# Patient Record
Sex: Female | Born: 2000 | Race: White | Hispanic: No | Marital: Single | Smoking: Never smoker
Health system: Southern US, Community
[De-identification: ages and names within clinical notes are randomized; demographics above are authoritative.]

## PROBLEM LIST (undated history)

## (undated) DIAGNOSIS — F4323 Adjustment disorder with mixed anxiety and depressed mood: Secondary | ICD-10-CM

## (undated) DIAGNOSIS — K219 Gastro-esophageal reflux disease without esophagitis: Secondary | ICD-10-CM

## (undated) DIAGNOSIS — E282 Polycystic ovarian syndrome: Secondary | ICD-10-CM

## (undated) HISTORY — PX: OTHER SURGICAL HISTORY: SHX169

---
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
## 2021-01-21 ENCOUNTER — Other Ambulatory Visit (HOSPITAL_COMMUNITY)
Admission: RE | Admit: 2021-01-21 | Discharge: 2021-01-21 | Disposition: A | Discharge status: Home / Self Care | Payer: Self-pay | Source: Ambulatory Visit | Attending: Family | Admitting: Family

## 2021-01-21 ENCOUNTER — Encounter: Payer: Self-pay | Admitting: Family

## 2021-01-21 ENCOUNTER — Ambulatory Visit: Payer: Self-pay | Admitting: Family

## 2021-01-21 ENCOUNTER — Ambulatory Visit (INDEPENDENT_AMBULATORY_CARE_PROVIDER_SITE_OTHER): Payer: Self-pay | Admitting: Family

## 2021-01-21 ENCOUNTER — Other Ambulatory Visit: Payer: Self-pay

## 2021-01-21 VITALS — BP 114/69 | HR 88 | Ht 63.25 in | Wt 208.0 lb

## 2021-01-21 DIAGNOSIS — Z3202 Encounter for pregnancy test, result negative: Secondary | ICD-10-CM

## 2021-01-21 DIAGNOSIS — Z3041 Encounter for surveillance of contraceptive pills: Secondary | ICD-10-CM

## 2021-01-21 DIAGNOSIS — R300 Dysuria: Secondary | ICD-10-CM

## 2021-01-21 DIAGNOSIS — Z113 Encounter for screening for infections with a predominantly sexual mode of transmission: Secondary | ICD-10-CM | POA: Insufficient documentation

## 2021-01-21 DIAGNOSIS — N898 Other specified noninflammatory disorders of vagina: Secondary | ICD-10-CM

## 2021-01-21 LAB — POCT URINE PREGNANCY: Preg Test, Ur: NEGATIVE

## 2021-01-21 NOTE — Progress Notes (Signed)
History was provided by the patient.  Sue Scott is a 20 y.o. female who is here for dysuria, vaginal discharge.   PCP confirmed? Yes.    Jay Schlichter, MD  HPI:   -pH is off, has been since BV. Doesn't smell fishy, but definitely a different odor -she and partner were not active for a week or so; he had something going on - dry skin  -dysuria and frequency x one week; taking cranberry pills no benefit  -no fever, abdominal pain, or pelvic pain  -2 missed pills about 2 days ago, has been really busy. Takes when she remembers.  -last active last night with partner.    Patient Active Problem List   Diagnosis Date Noted  . PCOS (polycystic ovarian syndrome) 03/07/2019  . Nexplanon in place 08/19/2018  . 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 Acne vulgaris 08/13/2017  . Hirsutism 06/18/2017  . 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 Adjustment disorder with mixed anxiety and depressed mood 06/18/2017    Current Outpatient Medications on File Prior to Visit  Medication Sig Dispense Refill  . norgestimate-ethinyl estradiol (SPRINTEC 28) 0.25-35 MG-MCG tablet Take 1 tablet by mouth daily. 84 tablet 3  . albuterol (VENTOLIN HFA) 108 (90 Base) MCG/ACT inhaler Inhale 2 puffs into the lungs every 4 (four) hours as needed for wheezing or shortness of breath. (Patient not taking: Reported on 01/21/2021) 6.7 g 0  . Cetirizine HCl (ZYRTEC PO) Take by mouth. (Patient not taking: Reported on 01/21/2021)    . EPINEPHrine 0.3 mg/0.3 mL IJ SOAJ injection Inject 0.3 mLs (0.3 mg total) into the muscle as needed for anaphylaxis. (Patient not taking: No sig reported) 1 each 2  . metroNIDAZOLE (FLAGYL) 500 MG tablet Take 1 tablet (500 mg total) by mouth 2 (two) times daily. (Patient not taking: Reported on 01/21/2021) 14 tablet 0  . norethindrone (AYGESTIN) 5 MG tablet Take 2 tablets (10 mg total) by mouth daily. 20 tablet 0   No current  facility-administered medications on file prior to visit.    Allergies  Allergen Reactions  . Other     ALL NUTS EXCEPT FOR PEANUTS    Physical Exam:    Vitals:   01/21/21 1436  BP: 114/69  Pulse: 88  Weight: 208 lb (94.3 kg)  Height: 5' 3.25" (1.607 m)    Blood pressure percentiles are not available for patients who are 18 years or older. No LMP recorded. (Menstrual status: Irregular Periods).  Physical Exam Constitutional:      Appearance: Normal appearance. She is not toxic-appearing.  HENT:     Head: Normocephalic.     Nose: Nose normal.  Eyes:     General: No scleral icterus.    Extraocular Movements: Extraocular movements intact.     Pupils: Pupils are equal, round, and reactive to light.  Cardiovascular:     Rate and Rhythm: Normal rate and regular rhythm.     Heart sounds: No murmur heard.   Pulmonary:     Effort: Pulmonary effort is normal.  Abdominal:     Tenderness: There is no abdominal tenderness.  Musculoskeletal:        General: No swelling. Normal range of motion.     Cervical back: Normal range of motion.  Lymphadenopathy:     Cervical: No cervical adenopathy.  Skin:    General: Skin is warm and dry.  Neurological:     General: No focal deficit  present.     Mental Status: She is alert and oriented to person, place, and time.  Psychiatric:        Mood and Affect: Mood is anxious.      Assessment/Plan:  Screen for infections, UTI, urine culture; advised condom use, discussed how to take pills when missed; she had to leave for another appt. Advised we would reach out by my chart for follow-up.    1. Dysuria - Urine Culture  2. Vaginal itching - WET PREP BY MOLECULAR PROBE  3. Visit for birth control pills maintenance  4. Routine screening for STI (sexually transmitted infection) - Urine cytology ancillary only  5. Pregnancy examination or test, negative result - POCT urine pregnancy

## 2021-01-22 ENCOUNTER — Other Ambulatory Visit: Payer: Self-pay | Admitting: Family

## 2021-01-22 ENCOUNTER — Encounter: Payer: Self-pay | Admitting: Family

## 2021-01-22 DIAGNOSIS — B9689 Other specified bacterial agents as the cause of diseases classified elsewhere: Secondary | ICD-10-CM

## 2021-01-22 DIAGNOSIS — N760 Acute vaginitis: Secondary | ICD-10-CM

## 2021-01-22 LAB — URINE CULTURE
MICRO NUMBER:: 11866176
SPECIMEN QUALITY:: ADEQUATE

## 2021-01-22 LAB — URINE CYTOLOGY ANCILLARY ONLY
Bacterial Vaginitis-Urine: NEGATIVE
Candida Urine: NEGATIVE
Chlamydia: NEGATIVE
Comment: NEGATIVE
Comment: NEGATIVE
Comment: NORMAL
Neisseria Gonorrhea: NEGATIVE
Trichomonas: NEGATIVE

## 2021-01-22 LAB — WET PREP BY MOLECULAR PROBE
Candida species: NOT DETECTED
MICRO NUMBER:: 11866158
SPECIMEN QUALITY:: ADEQUATE
Trichomonas vaginosis: NOT DETECTED

## 2021-01-22 MED ORDER — METRONIDAZOLE 500 MG PO TABS: 500.0000 mg | ORAL_TABLET | Freq: Two times a day (BID) | ORAL | 0 refills | Status: DC

## 2021-01-23 ENCOUNTER — Encounter: Payer: Self-pay | Admitting: Family

## 2021-02-26 ENCOUNTER — Other Ambulatory Visit (HOSPITAL_COMMUNITY)
Admission: RE | Admit: 2021-02-26 | Discharge: 2021-02-26 | Disposition: A | Discharge status: Home / Self Care | Payer: Self-pay | Source: Ambulatory Visit | Attending: Family | Admitting: Family

## 2021-02-26 ENCOUNTER — Ambulatory Visit (INDEPENDENT_AMBULATORY_CARE_PROVIDER_SITE_OTHER): Payer: Self-pay | Admitting: Family

## 2021-02-26 ENCOUNTER — Encounter: Payer: Self-pay | Admitting: Family

## 2021-02-26 ENCOUNTER — Other Ambulatory Visit: Payer: Self-pay

## 2021-02-26 VITALS — BP 131/76 | HR 104 | Ht 63.58 in | Wt 209.2 lb

## 2021-02-26 DIAGNOSIS — Z113 Encounter for screening for infections with a predominantly sexual mode of transmission: Secondary | ICD-10-CM | POA: Insufficient documentation

## 2021-02-26 DIAGNOSIS — R300 Dysuria: Secondary | ICD-10-CM

## 2021-02-26 DIAGNOSIS — B9689 Other specified bacterial agents as the cause of diseases classified elsewhere: Secondary | ICD-10-CM

## 2021-02-26 DIAGNOSIS — N760 Acute vaginitis: Secondary | ICD-10-CM

## 2021-02-26 DIAGNOSIS — Z1389 Encounter for screening for other disorder: Secondary | ICD-10-CM

## 2021-02-26 DIAGNOSIS — Z3202 Encounter for pregnancy test, result negative: Secondary | ICD-10-CM

## 2021-02-26 LAB — POCT URINALYSIS DIPSTICK
Bilirubin, UA: NEGATIVE
Blood, UA: NEGATIVE
Glucose, UA: NEGATIVE
Ketones, UA: NEGATIVE
Leukocytes, UA: NEGATIVE
Nitrite, UA: NEGATIVE
Protein, UA: NEGATIVE
Spec Grav, UA: 1.01 (ref 1.010–1.025)
Urobilinogen, UA: NEGATIVE E.U./dL — AB
pH, UA: 6.5 (ref 5.0–8.0)

## 2021-02-26 LAB — POCT URINE PREGNANCY: Preg Test, Ur: NEGATIVE

## 2021-02-26 NOTE — Progress Notes (Signed)
History was provided by the patient.  Sue Scott is a 20 y.o. female who is here for BV and dysuria.   PCP confirmed? Yes.    Jay Schlichter, MD  HPI:   -never finished Flagyl - took 1/2 of it  -BV symptoms present  -having some pain with urination  -went to [facility] - got tested for BV - gave pills and cream - used cream for 5 days; no improvement ; started spotting then discharge  -on BV pills x 10 days - still bleeding x 14 days  -taking sprintec no missed doses  -symptoms are getting better; bleeding is bothering her  -brown discharge  -not having intercourse as this time  -was using vaginal soap and now just using  -gets [place] waxes and unsure if this is contributing       Patient Active Problem List   Diagnosis Date Noted   PCOS (polycystic ovarian syndrome) 03/07/2019   Nexplanon in place 08/19/2018   Labial hypertrophy 08/13/2017   Acne vulgaris 08/13/2017   Hirsutism 06/18/2017   Morbid obesity with body mass index (BMI) 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 (Patient not taking: Reported on 01/21/2021) 6.7 g 0   Cetirizine HCl (ZYRTEC PO) Take by mouth. (Patient not taking: Reported on 01/21/2021)     EPINEPHrine 0.3 mg/0.3 mL IJ SOAJ injection Inject 0.3 mLs (0.3 mg total) into the muscle as needed for anaphylaxis. (Patient not taking: No sig reported) 1 each 2   metroNIDAZOLE (FLAGYL) 500 MG tablet Take 1 tablet (500 mg total) by mouth 2 (two) times daily. 14 tablet 0   norethindrone (AYGESTIN) 5 MG tablet Take 2 tablets (10 mg total) by mouth daily. 20 tablet 0   norgestimate-ethinyl estradiol (SPRINTEC 28)  0.25-35 MG-MCG tablet Take 1 tablet by mouth daily. 84 tablet 3   No current facility-administered medications on file prior to visit.    Allergies  Allergen Reactions   Other     ALL NUTS EXCEPT FOR PEANUTS    Physical Exam:    Vitals:   02/26/21 1623  BP: 131/76  Pulse: (!) 104  Weight: 209 lb 3.2 oz (94.9 kg)  Height: 5' 3.58" (1.615 m)    Blood pressure percentiles are not available for patients who are 18 years or older. No LMP recorded. (Menstrual status: Irregular Periods).  Physical Exam Vitals reviewed.  Constitutional:      Appearance: Normal appearance.  HENT:     Mouth/Throat:     Pharynx: Oropharynx is clear.  Eyes:     General: No scleral icterus.    Extraocular Movements: Extraocular movements intact.     Pupils: Pupils are equal, round, and reactive to light.  Cardiovascular:     Rate and Rhythm: Normal rate and regular rhythm.     Heart sounds: No murmur heard. Pulmonary:     Effort: Pulmonary effort is normal.  Abdominal:     General: There is no distension.  Musculoskeletal:        General: No swelling. Normal range of motion.     Cervical back: Normal range of motion.  Lymphadenopathy:     Cervical: No cervical  adenopathy.  Skin:    General: Skin is warm and dry.     Findings: No rash.  Neurological:     General: No focal deficit present.     Mental Status: She is alert and oriented to person, place, and time.  Psychiatric:        Mood and Affect: Mood normal.     Assessment/Plan:  -continue BV medications until complete -will culture urine; urine dip no nitrites  -vaginal hygiene reviewed; avoid OTC vaginal pH products -return precautions given   1. BV (bacterial vaginosis) 2. Dysuria 3. Routine screening for STI (sexually transmitted infection) - Urine cytology ancillary only - WET PREP BY MOLECULAR PROBE - Urine Culture  4. Pregnancy examination or test, negative result - POCT urine pregnancy  5. Screening for  genitourinary condition - POCT urinalysis dipstick

## 2021-02-27 LAB — URINE CULTURE
MICRO NUMBER:: 12006550
Result:: NO GROWTH
SPECIMEN QUALITY:: ADEQUATE

## 2021-02-27 LAB — WET PREP BY MOLECULAR PROBE
Candida species: NOT DETECTED
MICRO NUMBER:: 12006932
SPECIMEN QUALITY:: ADEQUATE
Trichomonas vaginosis: NOT DETECTED

## 2021-02-28 LAB — URINE CYTOLOGY ANCILLARY ONLY
Chlamydia: NEGATIVE
Comment: NEGATIVE
Comment: NORMAL
Neisseria Gonorrhea: NEGATIVE

## 2021-03-27 ENCOUNTER — Other Ambulatory Visit: Payer: Self-pay

## 2021-03-27 ENCOUNTER — Telehealth (INDEPENDENT_AMBULATORY_CARE_PROVIDER_SITE_OTHER): Payer: Self-pay | Admitting: Family

## 2021-03-27 ENCOUNTER — Encounter: Payer: Self-pay | Admitting: Family

## 2021-03-27 DIAGNOSIS — N898 Other specified noninflammatory disorders of vagina: Secondary | ICD-10-CM

## 2021-03-27 DIAGNOSIS — N921 Excessive and frequent menstruation with irregular cycle: Secondary | ICD-10-CM

## 2021-03-27 NOTE — Progress Notes (Signed)
THIS RECORD MAY CONTAIN CONFIDENTIAL INFORMATION THAT SHOULD NOT BE RELEASED WITHOUT REVIEW OF THE SERVICE PROVIDER.  Virtual Follow-Up Visit via Video Note  I connected with Sue Scott  on 03/27/21 at  2:30 PM EDT by a video enabled telemedicine application and verified that I am speaking with the correct person using two identifiers.   Patient/parent location: car in [place]    I discussed the limitations of evaluation and management by telemedicine and the availability of in person appointments.  I discussed that the purpose of this telehealth visit is to provide medical care while limiting exposure to the novel coronavirus.  The patient expressed understanding and agreed to proceed.   Sue Scott is a 20 y.o. female referred by Jay Schlichter, MD here today for follow-up of breakthrough bleeding with OCPs, PCOS.   History was provided by the patient.  Supervising Physician: Dr. Delorse Lek  Plan from Last Visit:   Sprintec Recently treated for BV  Chief Complaint: -breakthrough bleeding  -vaginal discharge   History of Present Illness:  -sprintec - last week took 4 pills (accidental, forgot she had already taken 2 in the morning and doubled again that evening) for breakthrough bleeding; subsequent nausea and headache, since resolved  -went back to one pill per day, bleeding came back  -more bleeding until yesterday, now just brown discharge -wants to make sure she doesn't have BV -no pain with intercourse, no pelvic pain  -some discharge changes, mostly brown sometimes with odor -no dysuria   Allergies  Allergen Reactions   Other     ALL NUTS EXCEPT FOR PEANUTS   Outpatient Medications Prior to Visit  Medication Sig Dispense Refill   albuterol (VENTOLIN HFA) 108 (90 Base) MCG/ACT inhaler Inhale 2 puffs into the lungs every 4 (four) hours as needed for wheezing or shortness of breath. (Patient not taking: Reported on 01/21/2021) 6.7 g 0   Cetirizine HCl (ZYRTEC PO) Take by  mouth. (Patient not taking: Reported on 01/21/2021)     EPINEPHrine 0.3 mg/0.3 mL IJ SOAJ injection Inject 0.3 mLs (0.3 mg total) into the muscle as needed for anaphylaxis. (Patient not taking: No sig reported) 1 each 2   metroNIDAZOLE (FLAGYL) 500 MG tablet Take 1 tablet (500 mg total) by mouth 2 (two) times daily. 14 tablet 0   norethindrone (AYGESTIN) 5 MG tablet Take 2 tablets (10 mg total) by mouth daily. 20 tablet 0   norgestimate-ethinyl estradiol (SPRINTEC 28) 0.25-35 MG-MCG tablet Take 1 tablet by mouth daily. 84 tablet 3   No facility-administered medications prior to visit.     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  Visual Observations/Objective:  General Appearance: Well nourished well developed, in no apparent distress.  Eyes: conjunctiva no swelling or erythema ENT/Mouth: No hoarseness, No cough for duration of visit.  Neck: Supple  Respiratory: Respiratory effort normal, normal rate, no retractions or distress.   Cardio: Appears well-perfused, noncyanotic Musculoskeletal: no obvious deformity Skin: visible  skin without rashes, ecchymosis, erythema Neuro: Awake and oriented X 3,  Psych:  normal affect, Insight and Judgment appropriate.    Assessment/Plan: 1. Breakthrough bleeding on OCPs 2. Vaginal discharge  -return back to one pill daily only; RN visit for swabs for wet prep/gc/c urine,  also pregnancy test    I discussed the assessment and treatment plan with the patient and/or parent/guardian.  They were provided an opportunity to ask questions and all were answered.  They agreed with the plan and demonstrated an understanding of the instructions. They were advised to call back or seek an in-person evaluation in the emergency room if the symptoms worsen or if the condition fails to improve as anticipated.   Follow-up:   pending swabs at RN visit   Medical decision-making:   I spent 20 minutes on this telehealth visit inclusive of face-to-face video and care coordination time I was located remote in [place] during this encounter.   Georges Mouse, NP    CC: Jay Schlichter, MD, Jay Schlichter, MD

## 2021-04-01 ENCOUNTER — Other Ambulatory Visit: Payer: Self-pay

## 2021-04-02 LAB — URINE CULTURE
MICRO NUMBER:: 12132649
SPECIMEN QUALITY:: ADEQUATE

## 2021-04-02 LAB — HIV ANTIBODY (ROUTINE TESTING W REFLEX): HIV 1&2 Ab, 4th Generation: NONREACTIVE

## 2021-04-02 LAB — VITAMIN D 25 HYDROXY (VIT D DEFICIENCY, FRACTURES): Vit D, 25-Hydroxy: 35 ng/mL (ref 30–100)

## 2021-04-03 ENCOUNTER — Encounter: Payer: Self-pay | Admitting: Family

## 2021-05-28 ENCOUNTER — Ambulatory Visit: Payer: Self-pay | Admitting: Family

## 2021-06-04 ENCOUNTER — Encounter: Payer: Self-pay | Admitting: Family

## 2021-06-04 ENCOUNTER — Ambulatory Visit (INDEPENDENT_AMBULATORY_CARE_PROVIDER_SITE_OTHER): Payer: Self-pay | Admitting: Family

## 2021-06-04 ENCOUNTER — Other Ambulatory Visit (HOSPITAL_COMMUNITY)
Admission: RE | Admit: 2021-06-04 | Discharge: 2021-06-04 | Disposition: A | Discharge status: Home / Self Care | Payer: Self-pay | Source: Ambulatory Visit | Attending: Family | Admitting: Family

## 2021-06-04 VITALS — BP 102/66 | HR 83 | Ht 64.0 in | Wt 207.0 lb

## 2021-06-04 DIAGNOSIS — N898 Other specified noninflammatory disorders of vagina: Secondary | ICD-10-CM

## 2021-06-04 DIAGNOSIS — Z113 Encounter for screening for infections with a predominantly sexual mode of transmission: Secondary | ICD-10-CM | POA: Insufficient documentation

## 2021-06-04 DIAGNOSIS — Z3202 Encounter for pregnancy test, result negative: Secondary | ICD-10-CM

## 2021-06-04 LAB — POCT URINALYSIS DIPSTICK
Bilirubin, UA: NEGATIVE
Blood, UA: NEGATIVE
Glucose, UA: NEGATIVE
Ketones, UA: NEGATIVE
Leukocytes, UA: NEGATIVE
Nitrite, UA: NEGATIVE
Protein, UA: POSITIVE — AB
Spec Grav, UA: 1.02 (ref 1.010–1.025)
Urobilinogen, UA: 0.2 E.U./dL
pH, UA: 6 (ref 5.0–8.0)

## 2021-06-04 NOTE — Progress Notes (Signed)
History was provided by the patient.  Sue Scott is a 20 y.o. female who is here for vaginal discharge.   PCP confirmed? Yes.    Jay Schlichter, MD  HPI:   -felt really depressed x 8 days about 1-2 weeks ago  -problems down there - feels like smell is bad  -urine smell was bad, yellow - urine smell stays on her  -tried cranberry juice and other stuff   -uses pH balance natural soap, [employer]  -tried using just water and smell didn't go away   -gets [place] wax  -no pain with intercourse  -stopped OCPs and has period every month  -LMP: ended on 9/12, bled for about 6-7 days   -has been going to therapy [facility]), trying to move out of home   -no pain or burning with urination  -wants to college - going to [employer] in [place], planing to move out which was a trigger with her mom   Stress eats, doesn't feel full   24 hr food recall  3 meals yesterday - [employer] Combo  Spaghetti os  Alfredo pasta  Pecan roll  A lot of fruit  Cupcake  Doesn't remember the rest  Water:  60 oz (about)   Wake time: 11AM Bed time: 3AM   Activity/Exercise: none but getting back to it soon   Patient Active Problem List   Diagnosis Date Noted   PCOS (polycystic ovarian syndrome) 03/07/2019   Nexplanon in place 08/19/2018   Labial hypertrophy 08/13/2017   Acne vulgaris 08/13/2017   Hirsutism 06/18/2017   Morbid obesity with body mass index (BMI) 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 (Patient not taking: Reported on 06/04/2021) 6.7 g 0   Cetirizine HCl (ZYRTEC PO) Take by mouth. (Patient not taking: Reported on 06/04/2021)      EPINEPHrine 0.3 mg/0.3 mL IJ SOAJ injection Inject 0.3 mLs (0.3 mg total) into the muscle as needed for anaphylaxis. (Patient not taking: Reported on 06/04/2021) 1 each 2   metroNIDAZOLE (FLAGYL) 500 MG tablet Take 1 tablet (500 mg total) by mouth 2 (two) times daily. 14 tablet 0   norethindrone (AYGESTIN) 5 MG tablet Take 2 tablets (10 mg total) by mouth daily. (Patient not taking: Reported on 06/04/2021) 20 tablet 0   norgestimate-ethinyl estradiol (SPRINTEC 28) 0.25-35 MG-MCG tablet Take 1 tablet by mouth daily. (Patient not taking: Reported on 06/04/2021) 84 tablet 3   No current facility-administered medications on file prior to visit.    Allergies  Allergen Reactions   Other     ALL NUTS EXCEPT FOR PEANUTS    Physical Exam:    Vitals:   06/04/21 1339  BP: 102/66  Pulse: 83  Weight: 207 lb (93.9 kg)  Height: 5\' 4"  (1.626 m)   Wt Readings from Last 3 Encounters:  06/04/21 207 lb (93.9 kg) (98 %, Z= 2.05)*  02/26/21 209 lb 3.2 oz (94.9 kg) (98 %, Z= 2.08)*  01/21/21 208 lb (94.3 kg) (98 %, Z= 2.06)*   * Growth percentiles are based on CDC (Girls, 2-20 Years) data.     Blood pressure percentiles are not available for patients who are  18 years or older. No LMP recorded. (Menstrual status: Irregular Periods).  Physical Exam Vitals reviewed.  Constitutional:      Appearance: Normal appearance. She is not toxic-appearing.  HENT:     Head: Normocephalic.     Mouth/Throat:     Pharynx: Oropharynx is clear.  Eyes:     General: No scleral icterus.    Extraocular Movements: Extraocular movements intact.     Pupils: Pupils are equal, round, and reactive to light.  Cardiovascular:     Rate and Rhythm: Normal rate and regular rhythm.     Heart sounds: No murmur heard. Pulmonary:     Effort: Pulmonary effort is normal.  Musculoskeletal:        General: No swelling. Normal range of motion.     Cervical back: Normal range of motion.  Lymphadenopathy:     Cervical: No  cervical adenopathy.  Skin:    General: Skin is warm and dry.     Findings: No rash.  Neurological:     General: No focal deficit present.     Mental Status: She is alert and oriented to person, place, and time.     Motor: No tremor.  Psychiatric:        Mood and Affect: Mood is anxious.     Assessment/Plan:  -unable to stay for pelvic exam to obtain genital culture  -probable BV based on history and symptoms  -will reach out via My Chart with results/medications needed  1. Foul smelling vaginal discharge - WET PREP BY MOLECULAR PROBE - Genital Culture - POCT urinalysis dipstick  2. Negative pregnancy test - POCT Pregnancy, Urine  3. Routine screening for STI (sexually transmitted infection) - Urine cytology ancillary only

## 2021-06-04 NOTE — Patient Instructions (Signed)

## 2021-06-05 ENCOUNTER — Other Ambulatory Visit: Payer: Self-pay | Admitting: Family

## 2021-06-05 ENCOUNTER — Encounter: Payer: Self-pay | Admitting: Family

## 2021-06-05 LAB — WET PREP BY MOLECULAR PROBE
Candida species: NOT DETECTED
MICRO NUMBER:: 12398297
SPECIMEN QUALITY:: ADEQUATE
Trichomonas vaginosis: NOT DETECTED

## 2021-06-05 MED ORDER — METRONIDAZOLE 500 MG PO TABS: 500.0000 mg | ORAL_TABLET | Freq: Two times a day (BID) | ORAL | 0 refills | Status: AC

## 2021-06-06 LAB — URINE CYTOLOGY ANCILLARY ONLY
Bacterial Vaginitis-Urine: NEGATIVE
Candida Urine: NEGATIVE
Chlamydia: NEGATIVE
Comment: NEGATIVE
Comment: NEGATIVE
Comment: NORMAL
Neisseria Gonorrhea: NEGATIVE
Trichomonas: NEGATIVE

## 2021-06-18 ENCOUNTER — Ambulatory Visit: Payer: Self-pay | Admitting: Family

## 2021-06-24 ENCOUNTER — Encounter: Payer: Self-pay | Admitting: Family

## 2021-06-24 ENCOUNTER — Other Ambulatory Visit: Payer: Self-pay

## 2021-06-24 ENCOUNTER — Ambulatory Visit (INDEPENDENT_AMBULATORY_CARE_PROVIDER_SITE_OTHER): Payer: Self-pay | Admitting: Family

## 2021-06-24 VITALS — BP 124/78 | HR 92 | Ht 63.78 in | Wt 211.0 lb

## 2021-06-24 DIAGNOSIS — E282 Polycystic ovarian syndrome: Secondary | ICD-10-CM

## 2021-06-24 DIAGNOSIS — Z3202 Encounter for pregnancy test, result negative: Secondary | ICD-10-CM

## 2021-06-24 DIAGNOSIS — Z113 Encounter for screening for infections with a predominantly sexual mode of transmission: Secondary | ICD-10-CM

## 2021-06-24 LAB — POCT URINE PREGNANCY: Preg Test, Ur: NEGATIVE

## 2021-06-24 LAB — POCT PREGNANCY, URINE

## 2021-06-24 MED ORDER — NORGESTREL-ETHINYL ESTRADIOL 0.3-30 MG-MCG PO TABS: 1.0000 | ORAL_TABLET | Freq: Every day | ORAL | 3 refills | Status: DC

## 2021-06-24 NOTE — Progress Notes (Signed)
History was provided by the patient.  Sue Scott is a 20 y.o. female who is here for BV follow up and .   PCP confirmed? Yes.    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 (Patient not taking: No sig reported) 6.7 g 0   Cetirizine HCl (ZYRTEC PO) Take by mouth. (Patient not taking: No sig reported)     EPINEPHrine 0.3 mg/0.3 mL IJ SOAJ injection Inject 0.3 mLs (0.3 mg total) into the muscle as needed for anaphylaxis. (Patient not taking: No sig reported) 1 each 2   norethindrone (AYGESTIN) 5 MG tablet Take 2 tablets (10 mg total) by mouth daily. (Patient not taking: No sig reported) 20 tablet 0   norgestimate-ethinyl estradiol (SPRINTEC 28) 0.25-35 MG-MCG tablet Take 1 tablet by mouth daily. (Patient not taking: No sig reported) 84 tablet 3   No current facility-administered medications on file prior to visit.    Allergies  Allergen Reactions   Other     ALL NUTS EXCEPT FOR PEANUTS    Physical Exam:    Vitals:   06/24/21 1602  BP: 124/78  Pulse: 92  Weight: 211 lb (95.7 kg)  Height: 5' 3.78" (1.62 m)   Wt Readings from Last 3 Encounters:  06/24/21 211  lb (95.7 kg) (98 %, Z= 2.10)*  06/04/21 207 lb (93.9 kg) (98 %, Z= 2.05)*  02/26/21 209 lb 3.2 oz (94.9 kg) (98 %, Z= 2.08)*   * Growth percentiles are based on CDC (Girls, 2-20 Years) data.     Blood pressure percentiles are not available for patients who are 18 years or older. Patient's last menstrual period was 05/16/2021.  Physical Exam Vitals reviewed.  Constitutional:      General: She is not in acute distress.    Appearance: Normal appearance.  HENT:     Head: Normocephalic.     Mouth/Throat:     Pharynx: Oropharynx is clear.  Eyes:     General: No scleral icterus.    Extraocular Movements: Extraocular movements intact.     Pupils: Pupils are equal, round, and reactive to light.  Neck:     Thyroid: No thyromegaly.  Cardiovascular:     Rate and Rhythm: Normal rate and regular rhythm.     Heart sounds: No murmur heard. Pulmonary:     Effort: Pulmonary effort is normal.  Musculoskeletal:        General: No swelling. Normal range of motion.     Cervical back: Normal range of motion and neck supple.  Lymphadenopathy:     Cervical: No cervical adenopathy.  Skin:    General: Skin is warm and dry.  Capillary Refill: Capillary refill takes less than 2 seconds.     Findings: No rash.  Neurological:     General: No focal deficit present.     Mental Status: She is alert and oriented to person, place, and time.     Motor: No tremor.  Psychiatric:        Mood and Affect: Mood is anxious.     Assessment/Plan:  1. PCOS (polycystic ovarian syndrome) -wants to restart birth control pills; no contraindication to estrogen use  -negative pregnancy test today  -last A1C was 4.9 on 12/29/19; repeat PCOS co-morb labs at next follow up   - norgestrel-ethinyl estradiol (LO/OVRAL) 0.3-30 MG-MCG tablet; Take 1 tablet by mouth daily.  Dispense: 84 tablet; Refill: 3  2. Negative pregnancy test - POCT Pregnancy, Urine - POCT urine pregnancy  3. Routine screening for STI  (sexually transmitted infection) -repeat for BV - WET PREP BY MOLECULAR PROBE

## 2021-06-25 ENCOUNTER — Encounter: Payer: Self-pay | Admitting: Family

## 2021-06-25 LAB — WET PREP BY MOLECULAR PROBE
Candida species: NOT DETECTED
MICRO NUMBER:: 12481867
SPECIMEN QUALITY:: ADEQUATE
Trichomonas vaginosis: NOT DETECTED

## 2021-06-26 ENCOUNTER — Other Ambulatory Visit: Payer: Self-pay | Admitting: Family

## 2021-06-26 ENCOUNTER — Other Ambulatory Visit: Payer: Self-pay | Admitting: Pediatrics

## 2021-06-26 ENCOUNTER — Telehealth: Payer: Self-pay

## 2021-06-26 MED ORDER — METRONIDAZOLE 500 MG PO TABS: 500.0000 mg | ORAL_TABLET | Freq: Two times a day (BID) | ORAL | 0 refills | Status: DC

## 2021-06-26 MED ORDER — METRONIDAZOLE 500 MG PO TABS: 500.0000 mg | ORAL_TABLET | Freq: Two times a day (BID) | ORAL | 1 refills | Status: DC

## 2021-06-26 NOTE — Telephone Encounter (Signed)
Patient called stating she went and picked up medication and it is the same dose and duration as she took before. She asks for clarification. Routing to [employer]

## 2021-06-27 NOTE — Telephone Encounter (Signed)
Sent MyChart message with Bernell List, FNP-C recommendations.

## 2021-07-25 ENCOUNTER — Encounter: Payer: Self-pay | Admitting: Family

## 2021-08-06 ENCOUNTER — Ambulatory Visit: Payer: Self-pay | Admitting: Family

## 2021-08-09 IMAGING — DX DG CHEST 1V PORT
1 series · 1 of 1 positions shown · non-contrast
Comparison: Two-view chest x-ray 10/12/2015

CLINICAL DATA: Cough and shortness of breath. H5CLL-4T positive
09/29/2018

EXAM:
PORTABLE CHEST 1 VIEW

[chest ap]
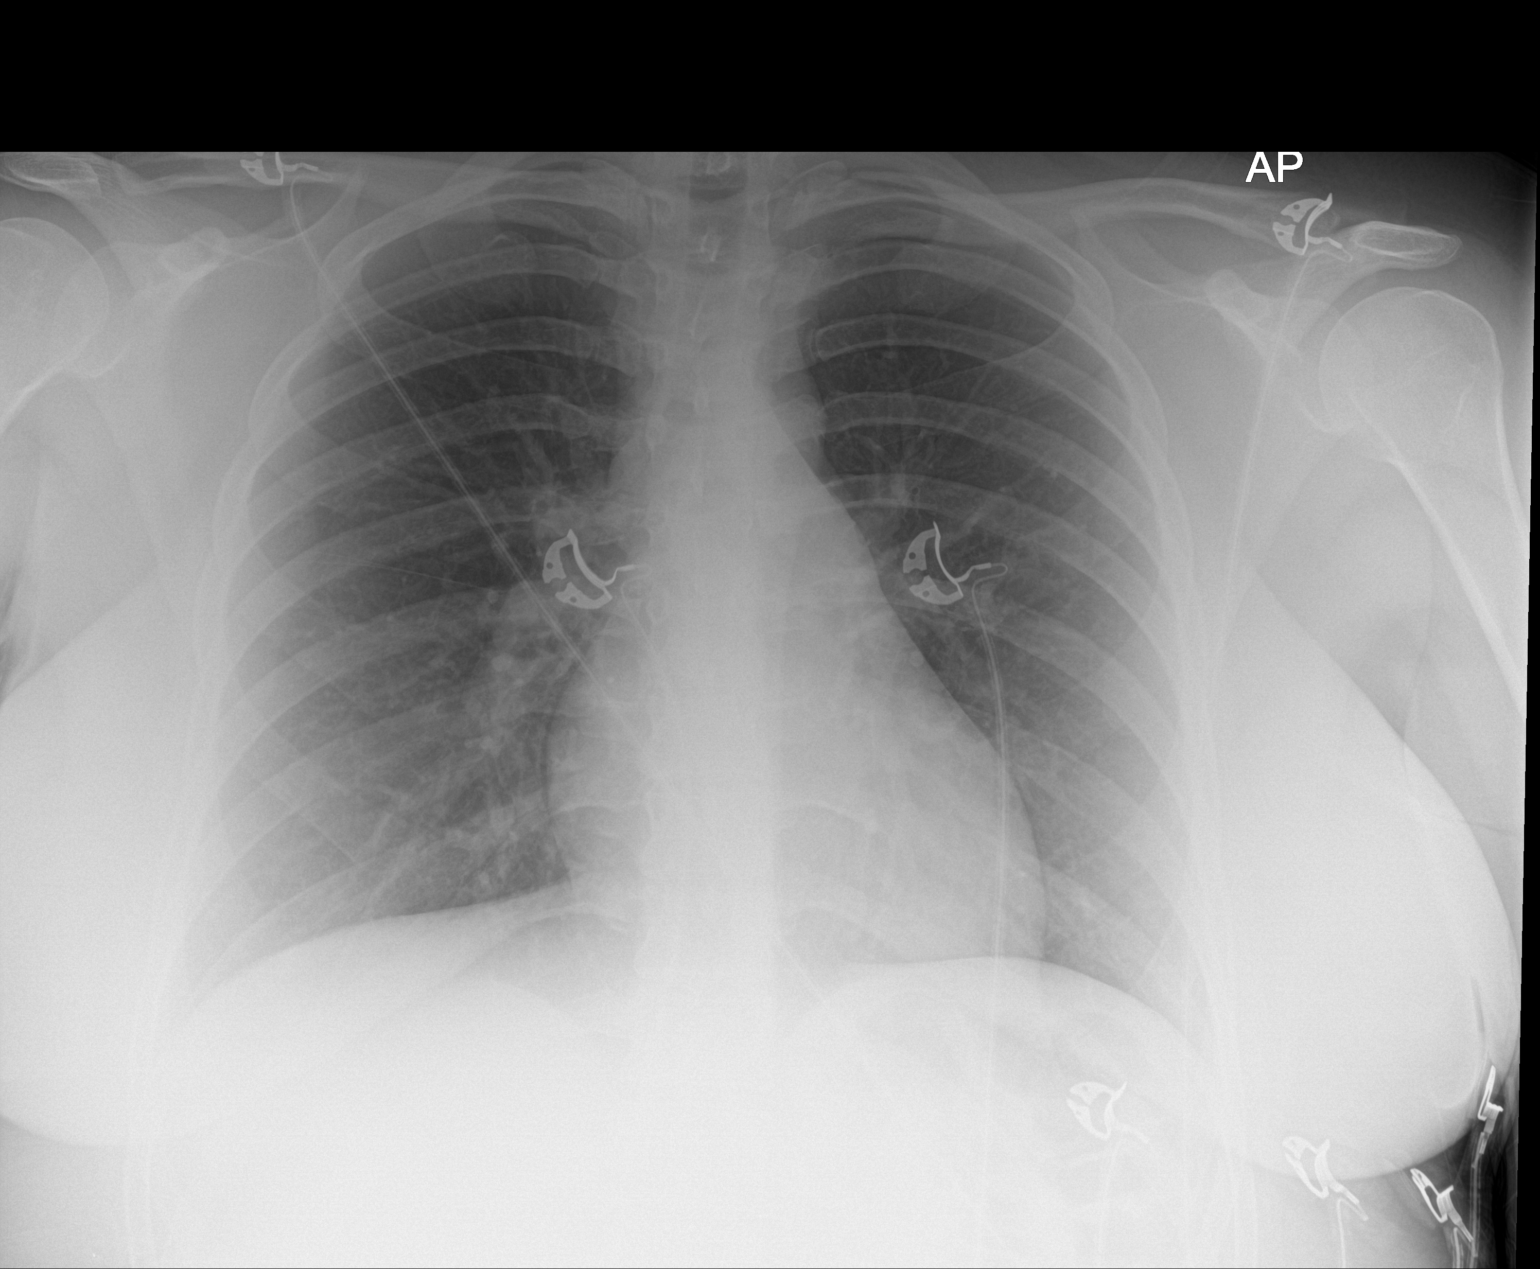

[1 of 1 positions shown; findings below may reference images not displayed]

FINDINGS: Heart size is normal. The lungs are clear. Visualized soft tissues
and bony thorax are unremarkable.
IMPRESSION: Negative chest.

## 2021-08-13 ENCOUNTER — Ambulatory Visit: Payer: Self-pay | Admitting: Family

## 2021-08-20 ENCOUNTER — Encounter: Payer: Self-pay | Admitting: Family

## 2021-08-20 ENCOUNTER — Ambulatory Visit (INDEPENDENT_AMBULATORY_CARE_PROVIDER_SITE_OTHER): Payer: Self-pay | Admitting: Family

## 2021-08-20 ENCOUNTER — Other Ambulatory Visit: Payer: Self-pay

## 2021-08-20 VITALS — BP 109/63 | HR 71 | Ht 63.48 in | Wt 213.6 lb

## 2021-08-20 DIAGNOSIS — Z3202 Encounter for pregnancy test, result negative: Secondary | ICD-10-CM

## 2021-08-20 DIAGNOSIS — N898 Other specified noninflammatory disorders of vagina: Secondary | ICD-10-CM

## 2021-08-20 DIAGNOSIS — B9689 Other specified bacterial agents as the cause of diseases classified elsewhere: Secondary | ICD-10-CM

## 2021-08-20 DIAGNOSIS — Z1389 Encounter for screening for other disorder: Secondary | ICD-10-CM

## 2021-08-20 DIAGNOSIS — N760 Acute vaginitis: Secondary | ICD-10-CM

## 2021-08-20 LAB — POCT URINALYSIS DIPSTICK
Bilirubin, UA: NEGATIVE
Blood, UA: NEGATIVE
Glucose, UA: NEGATIVE
Ketones, UA: POSITIVE
Nitrite, UA: NEGATIVE
Protein, UA: POSITIVE — AB
Spec Grav, UA: 1.015 (ref 1.010–1.025)
Urobilinogen, UA: 1 E.U./dL
pH, UA: 7 (ref 5.0–8.0)

## 2021-08-20 LAB — POCT URINE PREGNANCY: Preg Test, Ur: NEGATIVE

## 2021-08-20 NOTE — Progress Notes (Signed)
History was provided by the patient.  Sue Scott is a 20 y.o. female who is here for BV follow-up.   PCP confirmed? Yes.    Jay Schlichter, MD  HPI:   -returns to clinic for concerns for BV  -sometimes will burn with urination; last episode on Saturday, sometimes with frequency, usually the day after intercourse; sometimes will forget to pee after sex; BF allergic to latex; using withdrawal method  -changed soap and it helped a lot; no fragrance and that has improved  -had a rectal tear a few months ago from anal sex, was a lot of bleeding; healed then tried anal sex again a few weeks ago and had same issue; not using lubricant, unsure if she needs a medication. Has not seen blood in stool or wiping since healed from second attempt -boric acid tablets is helping + taking cranberry pills; wants to know if her partner can be treated -he has very dry skin and she wants to know if this is triggering the BV for her.    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 (Patient not taking: Reported on 08/20/2021) 14 tablet 1   norgestrel-ethinyl estradiol (LO/OVRAL) 0.3-30 MG-MCG tablet Take 1 tablet by mouth daily. (Patient not taking: Reported on 08/20/2021) 84 tablet 3   No current facility-administered medications on file prior to visit.    Allergies  Allergen Reactions   Other     ALL NUTS EXCEPT FOR PEANUTS    Physical Exam:    Vitals:   08/20/21  1113  BP: 109/63  Pulse: 71  Weight: 213 lb 9.6 oz (96.9 kg)  Height: 5' 3.48" (1.612 m)   Wt Readings from Last 3 Encounters:  08/20/21 213 lb 9.6 oz (96.9 kg) (98 %, Z= 2.13)*  06/24/21 211 lb (95.7 kg) (98 %, Z= 2.10)*  06/04/21 207 lb (93.9 kg) (98 %, Z= 2.05)*   * Growth percentiles are based on CDC (Girls, 2-20 Years) data.    Blood pressure percentiles are not available for patients who are 18 years or older. No LMP recorded. (Menstrual status: Irregular Periods).  Physical Exam Constitutional:      General: She is not in acute distress. HENT:     Head: Normocephalic.     Mouth/Throat:     Pharynx: Oropharynx is clear.  Eyes:     General: No scleral icterus.    Extraocular Movements: Extraocular movements intact.     Pupils: Pupils are equal, round, and reactive to light.  Cardiovascular:     Rate and Rhythm: Normal rate and regular rhythm.     Heart sounds: No murmur heard. Pulmonary:     Effort: Pulmonary effort is normal.  Genitourinary:    Comments: Deferred; self swab today  Musculoskeletal:        General: Normal range of motion.     Cervical back: Normal range of motion and neck supple.  Lymphadenopathy:     Cervical: No cervical  adenopathy.  Skin:    General: Skin is warm and dry.     Capillary Refill: Capillary refill takes less than 2 seconds.     Findings: No rash.  Neurological:     General: No focal deficit present.     Mental Status: She is alert and oriented to person, place, and time.  Psychiatric:        Mood and Affect: Mood normal.     Assessment/Plan:  20 yo female with recurrent BV. Symptoms likely consistent with reinfection; UA dip is unremarkable excluding protein + today as well as in September. Will continue to monitor symptoms; BP WNL today. Discussed vaginal hygiene as well as using lubricant for sexual intercourse; advised to monitor symptoms closely 2/2 anal sex and rectal bleeding. Exam deferred today as symptoms are  consistent with BV and no rectal bleeding or pain at present; would recommend GU exam/pelvic at next follow up if symptoms persists. Consider clindamycin vaginal gel for BV for this course.  She declines birth control at this time. Will send multivitamin with folic acid.  1. Vaginal discharge 2. BV (bacterial vaginosis) 3. Screening for genitourinary condition - POCT urinalysis dipstick 4. Pregnancy examination or test, negative result - POCT urine pregnancy

## 2021-08-21 ENCOUNTER — Encounter: Payer: Self-pay | Admitting: Family

## 2021-08-21 ENCOUNTER — Other Ambulatory Visit: Payer: Self-pay | Admitting: Family

## 2021-08-21 LAB — WET PREP BY MOLECULAR PROBE
Candida species: NOT DETECTED
MICRO NUMBER:: 12721006
SPECIMEN QUALITY:: ADEQUATE
Trichomonas vaginosis: NOT DETECTED

## 2021-08-21 MED ORDER — CENTRUM PO CHEW: 1.0000 | CHEWABLE_TABLET | Freq: Every day | ORAL | 2 refills | Status: AC

## 2021-08-21 MED ORDER — CLINDAMYCIN PHOSPHATE 2 % VA CREA: 1.0000 | TOPICAL_CREAM | Freq: Every day | VAGINAL | 0 refills | Status: AC

## 2022-01-01 ENCOUNTER — Encounter: Payer: Self-pay | Admitting: Family

## 2022-01-07 ENCOUNTER — Ambulatory Visit: Payer: Self-pay | Admitting: Family

## 2022-01-23 ENCOUNTER — Encounter: Payer: Self-pay | Admitting: Family

## 2022-01-27 ENCOUNTER — Ambulatory Visit
Admission: EM | Admit: 2022-01-27 | Discharge: 2022-01-27 | Disposition: A | Discharge status: Home / Self Care | Payer: Self-pay | Attending: Family Medicine | Admitting: Family Medicine

## 2022-01-27 ENCOUNTER — Other Ambulatory Visit: Payer: Self-pay

## 2022-01-27 DIAGNOSIS — J029 Acute pharyngitis, unspecified: Secondary | ICD-10-CM | POA: Insufficient documentation

## 2022-01-27 DIAGNOSIS — J069 Acute upper respiratory infection, unspecified: Secondary | ICD-10-CM | POA: Insufficient documentation

## 2022-01-27 LAB — POCT RAPID STREP A (OFFICE): Rapid Strep A Screen: NEGATIVE

## 2022-01-27 MED ORDER — PREDNISONE 20 MG PO TABS: 40.0000 mg | ORAL_TABLET | Freq: Every day | ORAL | 0 refills | Status: AC

## 2022-01-27 MED ORDER — ALBUTEROL SULFATE HFA 108 (90 BASE) MCG/ACT IN AERS: 1.0000 | INHALATION_SPRAY | RESPIRATORY_TRACT | 0 refills | Status: AC | PRN

## 2022-01-27 NOTE — ED Provider Notes (Signed)
?[facility] ? ? ? ?CSN: [id number] ?Arrival date & time: 01/27/22  1258 ? ? ?  ? ?History   ?Chief Complaint ?Chief Complaint  ?Patient presents with  ? Sore Throat  ? ? ?HPI ?Sue Scott is a 21 y.o. female.  ? ? ?Sore Throat ? ?Here for sore throat that began last night, pronounced. No f/c. ? ?About 4 days ago began having anterior sternal pain, not feeling good, and some cough. Some malaise. ? ?Does have asthma, but uncertain if has bothered her more in the last week. ? ?Boyfriend has been sick with similar. ? ? ? ?Past Medical History:  ?Diagnosis Date  ? Adjustment disorder with mixed anxiety and depressed mood   ? Asthma   ? GERD (gastroesophageal reflux disease)   ? PCOS (polycystic ovarian syndrome)   ? ? ?Patient Active Problem List  ? Diagnosis Date Noted  ? PCOS (polycystic ovarian syndrome) 03/07/2019  ? Nexplanon in place 08/19/2018  ? Labial hypertrophy 08/13/2017  ? Acne vulgaris 08/13/2017  ? Hirsutism 06/18/2017  ? Morbid obesity with body mass index (BMI) greater than 99th percentile for age in childhood [facility](HCC) 06/18/2017  ? Adjustment disorder with mixed anxiety and depressed mood 06/18/2017  ? ? ?Past Surgical History:  ?Procedure Laterality Date  ? adnoidectomy    ? extraction of wisdom teeth    ? ? ?OB History   ?No obstetric history on file. ?  ? ? ? ?Home Medications   ? ?Prior to Admission medications   ?Medication Sig Start Date End Date Taking? Authorizing Provider  ?albuterol (VENTOLIN HFA) 108 (90 Base) MCG/ACT inhaler Inhale 1-2 puffs into the lungs every 4 (four) hours as needed for wheezing or shortness of breath. 01/27/22  Yes Zenia ResidesBanister, Gola Bribiesca K, MD  ?predniSONE (DELTASONE) 20 MG tablet Take 2 tablets (40 mg total) by mouth daily with breakfast for 5 days. 01/27/22 02/01/22 Yes Zenia ResidesBanister, Deaglan Lile K, MD  ?multivitamin-iron-minerals-folic acid (CENTRUM) chewable tablet Chew 1 tablet by mouth daily. 08/21/21   Georges MouseJones, [name] M, NP  ?norgestrel-ethinyl estradiol (LO/OVRAL) 0.3-30  MG-MCG tablet Take 1 tablet by mouth daily. ?Patient not taking: Reported on 08/20/2021 06/24/21   Georges MouseJones, [name] M, NP  ? ? ?Family History ?Family History  ?Problem Relation Age of Onset  ? Healthy Mother   ? Healthy Father   ? Asthma Maternal Aunt   ? Crohn's disease Maternal Aunt   ? Asthma Maternal Grandmother   ? Anxiety disorder Maternal Grandmother   ? Depression Maternal Grandmother   ? Anxiety disorder Paternal Grandmother   ? Depression Paternal Grandmother   ? Lung disease Paternal Grandfather   ? Pulmonary fibrosis Paternal Grandfather   ? ? ?Social History ?Social History  ? ?Tobacco Use  ? Smoking status: Never  ?  Passive exposure: Yes  ? Smokeless tobacco: Never  ?Vaping Use  ? Vaping Use: Never used  ?Substance Use Topics  ? Alcohol use: No  ? Drug use: No  ? ? ? ?Allergies   ?Other ? ? ?Review of Systems ?Review of Systems ? ? ?Physical Exam ?Triage Vital Signs ?ED Triage Vitals  ?Enc Vitals Group  ?   BP 01/27/22 1459 122/80  ?   Pulse Rate 01/27/22 1459 88  ?   Resp 01/27/22 1459 18  ?   Temp 01/27/22 1459 98.4 ?F (36.9 ?C)  ?   Temp Source 01/27/22 1459 Oral  ?   SpO2 01/27/22 1459 97 %  ?   Weight --   ?  Height --   ?   Head Circumference --   ?   Peak Flow --   ?   Pain Score 01/27/22 1458 6  ?   Pain Loc --   ?   Pain Edu? --   ?   Excl. in GC? --   ? ?No data found. ? ?Updated Vital Signs ?BP 122/80 (BP Location: Right Arm)   Pulse 88   Temp 98.4 ?F (36.9 ?C) (Oral)   Resp 18   SpO2 97%  ? ?Visual Acuity ?Right Eye Distance:   ?Left Eye Distance:   ?Bilateral Distance:   ? ?Right Eye Near:   ?Left Eye Near:    ?Bilateral Near:    ? ?Physical Exam ?Vitals reviewed.  ?Constitutional:   ?   General: She is not in acute distress. ?   Appearance: She is not toxic-appearing.  ?HENT:  ?   Right Ear: Tympanic membrane and ear canal normal.  ?   Left Ear: Tympanic membrane and ear canal normal.  ?   Nose: Nose normal.  ?   Mouth/Throat:  ?   Mouth: Mucous membranes are moist.  ?   Comments:  Erythema of the posterior OP and tonsillar pillars. No asymmetry. ?Eyes:  ?   Extraocular Movements: Extraocular movements intact.  ?   Conjunctiva/sclera: Conjunctivae normal.  ?   Pupils: Pupils are equal, round, and reactive to light.  ?Cardiovascular:  ?   Rate and Rhythm: Normal rate and regular rhythm.  ?   Heart sounds: No murmur heard. ?Pulmonary:  ?   Effort: Pulmonary effort is normal. No respiratory distress.  ?   Breath sounds: No wheezing, rhonchi or rales.  ?Chest:  ?   Chest wall: Tenderness (anterior central chest) present.  ?Musculoskeletal:  ?   Cervical back: Neck supple.  ?Lymphadenopathy:  ?   Cervical: No cervical adenopathy.  ?Skin: ?   Capillary Refill: Capillary refill takes less than 2 seconds.  ?   Coloration: Skin is not jaundiced or pale.  ?Neurological:  ?   General: No focal deficit present.  ?   Mental Status: She is alert and oriented to person, place, and time.  ?Psychiatric:     ?   Behavior: Behavior normal.  ? ? ? ?UC Treatments / Results  ?Labs ?(all labs ordered are listed, but only abnormal results are displayed) ?Labs Reviewed  ?CULTURE, GROUP A STREP [facility])  ?NOVEL CORONAVIRUS, NAA  ?POCT RAPID STREP A (OFFICE)  ? ? ?EKG ? ? ?Radiology ?No results found. ? ?Procedures ?Procedures (including critical care time) ? ?Medications Ordered in UC ?Medications - No data to display ? ?Initial Impression / Assessment and Plan / UC Course  ?I have reviewed the triage vital signs and the nursing notes. ? ?Pertinent labs & imaging results that were available during my care of the patient were reviewed by me and considered in my medical decision making (see chart for details). ? ?  ? ?Strep is negative; culture sent, and we will call and tx per protocol if positive. Covid swab done to see if needs to quarantine further. She is already at day 4, so unlikely oral antivirals to be of value if positive. ? ?I will tx poss asthma exacerbation with albuterol and prednisone. ?Final Clinical  Impressions(s) / UC Diagnoses  ? ?Final diagnoses:  ?Sore throat  ?Acute upper respiratory infection  ? ? ? ?Discharge Instructions   ? ?  ?Your strep test is negative.  Culture of  the throat will be sent, and staff will notify you if that is in turn positive. ? ? Albuterol inhaler--do 2 puffs every 4 hours as needed for shortness of breath or wheezing  ? ?Prednisone 20 mg --2 daily for 5 days. ? ?You have been swabbed for COVID, and the test will result in the next 24 hours. Our staff will call you if positive. If the test is positive, you should quarantine for 5 days.  ? ? ? ? ?ED Prescriptions   ? ? Medication Sig Dispense Auth. Provider  ? albuterol (VENTOLIN HFA) 108 (90 Base) MCG/ACT inhaler Inhale 1-2 puffs into the lungs every 4 (four) hours as needed for wheezing or shortness of breath. 1 each Zenia Resides, MD  ? predniSONE (DELTASONE) 20 MG tablet Take 2 tablets (40 mg total) by mouth daily with breakfast for 5 days. 10 tablet Zenia Resides, MD  ? ?  ? ?PDMP not reviewed this encounter. ?  ?Zenia Resides, MD ?01/27/22 1532 ? ?

## 2022-01-27 NOTE — ED Triage Notes (Signed)
Patient presents to Urgent Care with complaints of sore throat, cough, congestion  since 4 days ago. Patient reports dayquill, otc for symptoms.  ? ?

## 2022-01-27 NOTE — Discharge Instructions (Addendum)
Your strep test is negative.  Culture of the throat will be sent, and staff will notify you if that is in turn positive. ? ? Albuterol inhaler--do 2 puffs every 4 hours as needed for shortness of breath or wheezing  ? ?Prednisone 20 mg --2 daily for 5 days. ? ?You have been swabbed for COVID, and the test will result in the next 24 hours. Our staff will call you if positive. If the test is positive, you should quarantine for 5 days.  ?

## 2022-01-28 LAB — NOVEL CORONAVIRUS, NAA: SARS-CoV-2, NAA: NOT DETECTED

## 2022-01-30 LAB — CULTURE, GROUP A STREP (THRC)

## 2022-02-07 ENCOUNTER — Encounter: Payer: Self-pay | Admitting: Family

## 2022-02-17 ENCOUNTER — Encounter: Payer: Self-pay | Admitting: Family

## 2022-02-17 ENCOUNTER — Ambulatory Visit (INDEPENDENT_AMBULATORY_CARE_PROVIDER_SITE_OTHER): Payer: Self-pay | Admitting: Family

## 2022-02-17 VITALS — BP 109/70 | HR 85 | Ht 63.62 in | Wt 165.0 lb

## 2022-02-17 DIAGNOSIS — R634 Abnormal weight loss: Secondary | ICD-10-CM

## 2022-02-17 DIAGNOSIS — L700 Acne vulgaris: Secondary | ICD-10-CM

## 2022-02-17 DIAGNOSIS — Z3202 Encounter for pregnancy test, result negative: Secondary | ICD-10-CM

## 2022-02-17 DIAGNOSIS — L680 Hirsutism: Secondary | ICD-10-CM

## 2022-02-17 DIAGNOSIS — F4323 Adjustment disorder with mixed anxiety and depressed mood: Secondary | ICD-10-CM

## 2022-02-17 DIAGNOSIS — N926 Irregular menstruation, unspecified: Secondary | ICD-10-CM

## 2022-02-17 DIAGNOSIS — E559 Vitamin D deficiency, unspecified: Secondary | ICD-10-CM

## 2022-02-17 DIAGNOSIS — E282 Polycystic ovarian syndrome: Secondary | ICD-10-CM

## 2022-02-17 MED ORDER — ESCITALOPRAM OXALATE 10 MG PO TABS: 10.0000 mg | ORAL_TABLET | Freq: Every day | ORAL | 0 refills | Status: DC

## 2022-02-17 NOTE — Progress Notes (Signed)
History was provided by the patient.  Sue Scott is a 21 y.o. female who is here for recent weight loss, anxiety.   PCP confirmed? Yes.    Sue Schlichter, MD  HPI:    -Keto, has been feeling off and on -dizziness, sometimes depends on week -tries to change it up; has been eating too much red meat and diet coke  -BF Sue Scott - when she diet drinks (7 cans, bottles)  -water intake: not enough  -juice: cranberry or zero sugar  -working 2 jobs, school  -has come a long way  -still goes to therapy twice daily  -no vaping, no drugs, no ETOH  -period 2 months ago, bled a lot, was really heavy and bled through tampons  -apple cider/baking soda bath - helped with BV  -wants to see nutritionist  -asking about prozac    Patient Active Problem List   Diagnosis Date Noted   PCOS (polycystic ovarian syndrome) 03/07/2019   Nexplanon in place 08/19/2018   Labial hypertrophy 08/13/2017   Acne vulgaris 08/13/2017   Hirsutism 06/18/2017   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 1 each 0   multivitamin-iron-minerals-folic acid (CENTRUM) chewable tablet Chew 1 tablet by mouth daily. 90 tablet 2   norgestrel-ethinyl estradiol (LO/OVRAL) 0.3-30 MG-MCG tablet Take 1 tablet by mouth daily. (Patient not taking: Reported on 08/20/2021) 84 tablet 3   No current facility-administered medications on file prior to visit.    Allergies  Allergen Reactions   Other     ALL NUTS EXCEPT FOR PEANUTS    Physical Exam:    Vitals:   02/17/22 1125  BP: 109/70  Pulse: 85  Weight: 165 lb (74.8 kg)  Height: 5' 3.62"  (1.616 m)   Wt Readings from Last 3 Encounters:  02/17/22 165 lb (74.8 kg) (90 %, Z= 1.26)*  08/20/21 213 lb 9.6 oz (96.9 kg) (98 %, Z= 2.13)*  06/24/21 211 lb (95.7 kg) (98 %, Z= 2.10)*   * Growth percentiles are based on CDC (Girls, 2-20 Years) data.    48 lb weight loss since December 2022, 7 months  Blood pressure percentiles are not available for patients who are 18 years or older. No LMP recorded. (Menstrual status: Irregular Periods).  Physical Exam Constitutional:      General: She is not in acute distress.    Appearance: She is well-developed.  HENT:     Head: Normocephalic and atraumatic.  Eyes:     General: No scleral icterus.    Pupils: Pupils are equal, round, and reactive to light.  Neck:     Thyroid: No thyromegaly.  Cardiovascular:     Rate and Rhythm: Normal rate and regular rhythm.     Heart sounds: Normal heart sounds. No murmur heard. Pulmonary:     Effort: Pulmonary effort is normal.     Breath sounds: Normal breath sounds.  Abdominal:     Palpations: Abdomen is soft.  Musculoskeletal:        General: Normal range of motion.     Cervical back: Normal range of motion and neck supple.  Lymphadenopathy:  Cervical: No cervical adenopathy.  Skin:    General: Skin is warm and dry.     Findings: No rash.     Comments: Acne - face, some on back Hirsute - face, chin   Neurological:     Mental Status: She is alert and oriented to person, place, and time.     Cranial Nerves: No cranial nerve deficit.  Psychiatric:        Mood and Affect: Mood is anxious.        Behavior: Behavior normal.        Thought Content: Thought content normal.        Judgment: Judgment normal.     Assessment/Plan:   [name] presents today with mom and boyfriend Sue Scott for concerns of increased anxiety and desire to start medications for it; she is in therapy with new therapist x 4 monthsalso wanted to check in on PCOS. Will obtain eating disorder protocol labs today as well  as PCOS co-morbid blood work. Discussed that her symptoms are likely dehydration as she is not drinking water and is drinking soda with all thirst cues. Referral to Nutritionist today. Start Lexapro 10 mg today. Consider spironolactone start in 2 weeks at check-in.   Adjustment disorder with mixed anxiety and depressed mood -start lexapro 10 mg daily in morning; continue with therapy -discussed SSRI MOA, adverse and expected effects, time to efficacy  -return in 2 weeks; return precautions reviewed   2. Weight loss - Amylase - CBC With Differential - Comprehensive metabolic panel - Ferritin - Lipase - Magnesium - Phosphorus - Sedimentation rate - Thyroid Panel With TSH  3. PCOS (polycystic ovarian syndrome) - CBC with Differential/Platelet - Hemoglobin A1c - Lipid panel - Testos,Total,Free and SHBG (Female)  4. Irregular periods 5. Hirsutism 6. Acne vulgaris   7. Vitamin D deficiency - VITAMIN D 25 Hydroxy (Vit-D Deficiency, Fractures)  8. Pregnancy examination or test, negative result - POCT urine pregnancy

## 2022-02-17 NOTE — Patient Instructions (Signed)
Start taking Lexapro 10 mg daily in morning.  I will reach out by my chart with your lab results.

## 2022-02-18 ENCOUNTER — Encounter: Payer: Self-pay | Admitting: Family

## 2022-02-20 LAB — PHOSPHORUS: Phosphorus: 3.3 mg/dL (ref 2.7–5.0)

## 2022-02-20 LAB — THYROID PANEL WITH TSH
Free Thyroxine Index: 2.2 (ref 1.4–3.8)
T3 Uptake: 31 % (ref 22–35)
T4, Total: 7.1 ug/dL (ref 5.3–11.7)
TSH: 1.27 mIU/L

## 2022-02-20 LAB — COMPREHENSIVE METABOLIC PANEL
AG Ratio: 1.6 (calc) (ref 1.0–2.5)
ALT: 11 U/L (ref 5–32)
AST: 11 U/L — ABNORMAL LOW (ref 12–32)
Albumin: 4.4 g/dL (ref 3.6–5.1)
Alkaline phosphatase (APISO): 65 U/L (ref 36–128)
BUN: 15 mg/dL (ref 7–20)
CO2: 29 mmol/L (ref 20–32)
Calcium: 9.9 mg/dL (ref 8.9–10.4)
Chloride: 103 mmol/L (ref 98–110)
Creat: 0.63 mg/dL (ref 0.50–0.96)
Globulin: 2.7 g/dL (calc) (ref 2.0–3.8)
Glucose, Bld: 83 mg/dL (ref 65–99)
Potassium: 4.4 mmol/L (ref 3.8–5.1)
Sodium: 141 mmol/L (ref 135–146)
Total Bilirubin: 0.3 mg/dL (ref 0.2–1.1)
Total Protein: 7.1 g/dL (ref 6.3–8.2)

## 2022-02-20 LAB — SEDIMENTATION RATE: Sed Rate: 6 mm/h (ref 0–20)

## 2022-02-20 LAB — CBC WITH DIFFERENTIAL/PLATELET
Absolute Monocytes: 523 cells/uL (ref 200–950)
Basophils Absolute: 19 cells/uL (ref 0–200)
Basophils Relative: 0.4 %
Eosinophils Absolute: 91 cells/uL (ref 15–500)
Eosinophils Relative: 1.9 %
HCT: 41.4 % (ref 35.0–45.0)
Hemoglobin: 13.8 g/dL (ref 11.7–15.5)
Lymphs Abs: 1454 cells/uL (ref 850–3900)
MCH: 29.9 pg (ref 27.0–33.0)
MCHC: 33.3 g/dL (ref 32.0–36.0)
MCV: 89.8 fL (ref 80.0–100.0)
MPV: 13.1 fL — ABNORMAL HIGH (ref 7.5–12.5)
Monocytes Relative: 10.9 %
Neutro Abs: 2712 cells/uL (ref 1500–7800)
Neutrophils Relative %: 56.5 %
Platelets: 173 10*3/uL (ref 140–400)
RBC: 4.61 10*6/uL (ref 3.80–5.10)
RDW: 12.3 % (ref 11.0–15.0)
Total Lymphocyte: 30.3 %
WBC: 4.8 10*3/uL (ref 3.8–10.8)

## 2022-02-20 LAB — LIPID PANEL
Cholesterol: 116 mg/dL (ref <170)
HDL: 37 mg/dL — ABNORMAL LOW (ref >45)
LDL Cholesterol (Calc): 64 mg/dL (calc) (ref <110)
Non-HDL Cholesterol (Calc): 79 mg/dL (calc) (ref <120)
Total CHOL/HDL Ratio: 3.1 (calc) (ref <5.0)
Triglycerides: 74 mg/dL (ref <90)

## 2022-02-20 LAB — TESTOS,TOTAL,FREE AND SHBG (FEMALE)
Free Testosterone: 10.4 pg/mL — ABNORMAL HIGH (ref 0.1–6.4)
Sex Hormone Binding: 24 nmol/L (ref 17–124)
Testosterone, Total, LC-MS-MS: 56 ng/dL — ABNORMAL HIGH (ref 2–45)

## 2022-02-20 LAB — HEMOGLOBIN A1C
Hgb A1c MFr Bld: 5 % of total Hgb (ref <5.7)
Mean Plasma Glucose: 97 mg/dL
eAG (mmol/L): 5.4 mmol/L

## 2022-02-20 LAB — VITAMIN D 25 HYDROXY (VIT D DEFICIENCY, FRACTURES): Vit D, 25-Hydroxy: 42 ng/mL (ref 30–100)

## 2022-02-20 LAB — AMYLASE: Amylase: 26 U/L (ref 21–101)

## 2022-02-20 LAB — MAGNESIUM: Magnesium: 2 mg/dL (ref 1.5–2.5)

## 2022-02-20 LAB — LIPASE: Lipase: 36 U/L (ref 7–60)

## 2022-02-20 LAB — FERRITIN: Ferritin: 57 ng/mL (ref 16–154)

## 2022-02-21 ENCOUNTER — Telehealth: Payer: Self-pay | Admitting: *Deleted

## 2022-02-21 ENCOUNTER — Encounter: Payer: Self-pay | Admitting: Family

## 2022-02-21 NOTE — Telephone Encounter (Signed)
Sue Scott LVM asking to discuss test results.

## 2022-03-04 ENCOUNTER — Ambulatory Visit (INDEPENDENT_AMBULATORY_CARE_PROVIDER_SITE_OTHER): Payer: Self-pay | Admitting: Family

## 2022-03-04 ENCOUNTER — Encounter: Payer: Self-pay | Admitting: Family

## 2022-03-04 VITALS — BP 107/66 | HR 62 | Ht 63.39 in | Wt 161.8 lb

## 2022-03-04 DIAGNOSIS — N926 Irregular menstruation, unspecified: Secondary | ICD-10-CM

## 2022-03-04 DIAGNOSIS — R634 Abnormal weight loss: Secondary | ICD-10-CM

## 2022-03-04 DIAGNOSIS — L680 Hirsutism: Secondary | ICD-10-CM

## 2022-03-04 DIAGNOSIS — N911 Secondary amenorrhea: Secondary | ICD-10-CM

## 2022-03-04 DIAGNOSIS — F4323 Adjustment disorder with mixed anxiety and depressed mood: Secondary | ICD-10-CM

## 2022-03-04 DIAGNOSIS — L700 Acne vulgaris: Secondary | ICD-10-CM

## 2022-03-04 DIAGNOSIS — E282 Polycystic ovarian syndrome: Secondary | ICD-10-CM

## 2022-03-04 LAB — POCT URINE PREGNANCY: Preg Test, Ur: NEGATIVE

## 2022-03-04 MED ORDER — SPIRONOLACTONE 25 MG PO TABS: 25.0000 mg | ORAL_TABLET | Freq: Every day | ORAL | 0 refills | Status: DC

## 2022-03-04 MED ORDER — NORGESTIMATE-ETH ESTRADIOL 0.25-35 MG-MCG PO TABS: ORAL_TABLET | ORAL | 4 refills | Status: DC

## 2022-03-04 NOTE — Progress Notes (Signed)
History was provided by the patient.  Sue Scott is a 21 y.o. female who is here for f/u of weight loss, anxiety, depression and PCOS.   PCP confirmed? Yes.    Sue Schlichter, MD  HPI:    - still ketogenic diet, adding more fruits now and supplementing protein as she wants to gain muscle - started going to the gym this week - started Lexapro, has not had side effects. Feels like it is helping with depression, mood swings, over thinking. Taking 10 mg daily. Feels like the dose is adequate. Smiling more.  - therapy weekly. Peer support biweekly.  - not dizzy anymore, drinking less soda (only one a day). Increased water intake to 4 bottles a day.  - does have presyncope when she has not eaten enough  - eating red meat only 1-2 times per week now - working 2 jobs, working on [employer] her high school diploma  - no vaping, no drugs, no ETOH  - LMP 6 months ago, no chance she could be pregnant, rapid pregnancy test two weeks ago   - continued interest in seeing nutritionist  - would like to start spironolactone and is interested in OCPs. More hesitant about OCPs as she has had negative experience in [place] including adverse mood effects and breakthrough bleeding.   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 (Patient not taking: Reported on 03/04/2022)     Ascorbic Acid (VITAMIN C) 100 MG tablet Take 100 mg by mouth daily.     Boric Acid Vaginal 600 MG SUPP Place vaginally.     Cyanocobalamin (B-12 PO) Take  by mouth.     FENUGREEK PO Take by mouth.     Ferrous Gluconate (IRON 27 PO) Take by mouth.     Lactobacillus (ACIDOPHILUS PO) Take by mouth.     Misc Natural Products (CRANBERRY/PROBIOTIC PO) Take by mouth.     multivitamin-iron-minerals-folic acid (CENTRUM) chewable tablet Chew 1 tablet by mouth daily. 90 tablet 2   Omega-3 Fatty Acids (OMEGA-3 EPA FISH OIL PO) Take by mouth.     Turmeric (QC TUMERIC COMPLEX PO) Take by mouth.     albuterol (VENTOLIN HFA) 108 (90 Base) MCG/ACT inhaler Inhale 1-2 puffs into the lungs every 4 (four) hours as needed for wheezing or shortness of breath. 1 each 0   norgestrel-ethinyl estradiol (LO/OVRAL) 0.3-30 MG-MCG tablet Take 1 tablet by mouth daily. (Patient not taking: Reported on 08/20/2021) 84 tablet 3   No current facility-administered medications on file prior to visit.    Allergies  Allergen Reactions   Other     ALL NUTS EXCEPT FOR PEANUTS    Physical Exam:    Vitals:   02/17/22 1125  BP: 109/70  Pulse: 85  Weight: 165 lb (74.8 kg)  Height: 5' 3.62" (1.616 m)   Wt Readings from Last 3 Encounters:  03/04/22 161 lb 12.8 oz (73.4 kg) (88 %, Z= 1.18)*  02/17/22 165 lb (74.8 kg) (90 %, Z= 1.26)*  08/20/21 213 lb  9.6 oz (96.9 kg) (98 %, Z= 2.13)*   * Growth percentiles are based on CDC (Girls, 2-20 Years) data.    52 lb weight loss since December 2022, 7 months  Blood pressure %iles are not available for patients who are 18 years or older. No LMP recorded. (Menstrual status: Irregular Periods).  General: Alert, well-appearing in NAD. Talkative.  HEENT: Normocephalic, No signs of head trauma. PERRL. EOM intact. Sclerae are anicteric. Moist mucous membranes.  Neck: Supple Cardiovascular: Regular rate and rhythm, S1 and S2 normal. No murmur, rub, or gallop appreciated. Cap refill <2 seconds.  Pulmonary: Normal work of breathing. Clear to auscultation bilaterally with no wheezes or crackles present. Abdomen: Soft, non-tender,  non-distended. Extremities: Warm and well-perfused, without cyanosis or edema.  Neurologic: No focal deficits Skin: No rashes or lesions. Psych: Mood and affect are appropriate.   Assessment/Plan:  Sue Scott presents today with boyfriend Sue Scott for f/u of anxiety and depression; Lexapro initiated 6/5. Additionally to discuss PCOS labs, specifically elevated testosterone. Complete PCOS and metabolic panel obtained last visit with results notable for testosterone of 56. Sue Scott is interested in starting Spironolactone and birth control today to aid in androgen effects of PCOS as well as irregular menstrual cycles. Endorses 1-2 menstrual cycles in the last year. Detailed discussion regarding importance of regulating menstrual cycles today in office.   Anxiety and depression symptoms greatly improved with 10 mg Lexapro. She is taking her medication daily and reports she is happy with her dose and her mood; she is smiling more. Continues with weekly therapy.    Continues with ketogenic diet but is introducing more fruits. Started going to the gym this week in hopes of building muscle. Four pound weight loss since last visit 6/5. Will continue to monitor.   Adjustment disorder with mixed anxiety and depressed mood -continue Lexapro 10 mg daily in morning; continue with therapy  2. PCOS (polycystic ovarian syndrome) -initiate Spironolactone 25 mg daily today -initiate Sprintec daily today -repeat testosterone labs in 3 months   3. Irregular periods -initiate Sprintec daily today  4. Hirsutism -initiate Spironolactone 25 mg daily today  5. Acne vulgaris 6. Weight loss  7. Pregnancy examination or test, negative result - POCT urine pregnancy  Return in 4 weeks for continued f/u of mood and f/u hormonal therapies   Supervising Provider Co-Signature  I reviewed with the resident the medical history and the resident's findings on physical examination.  I discussed with the resident the  patient's diagnosis and concur with the treatment plan as documented in the resident's note.  Georges Mouse, NP

## 2022-03-05 ENCOUNTER — Other Ambulatory Visit: Payer: Self-pay | Admitting: Family

## 2022-03-05 ENCOUNTER — Encounter: Payer: Self-pay | Admitting: Family

## 2022-03-05 DIAGNOSIS — R634 Abnormal weight loss: Secondary | ICD-10-CM

## 2022-03-05 DIAGNOSIS — E282 Polycystic ovarian syndrome: Secondary | ICD-10-CM

## 2022-03-05 NOTE — Progress Notes (Signed)
nut

## 2022-03-12 ENCOUNTER — Encounter: Payer: Self-pay | Admitting: Family

## 2022-03-15 ENCOUNTER — Other Ambulatory Visit: Payer: Self-pay | Admitting: Pediatrics

## 2022-03-15 DIAGNOSIS — F4323 Adjustment disorder with mixed anxiety and depressed mood: Secondary | ICD-10-CM

## 2022-03-15 MED ORDER — ESCITALOPRAM OXALATE 10 MG PO TABS: 10.0000 mg | ORAL_TABLET | Freq: Every day | ORAL | 3 refills | Status: DC

## 2022-05-14 ENCOUNTER — Encounter: Payer: Self-pay | Admitting: Registered"

## 2022-05-21 ENCOUNTER — Ambulatory Visit: Payer: Self-pay | Admitting: Registered"

## 2022-05-26 ENCOUNTER — Ambulatory Visit: Payer: Self-pay | Admitting: Registered"

## 2022-08-11 ENCOUNTER — Encounter (HOSPITAL_COMMUNITY): Payer: Self-pay

## 2022-08-11 ENCOUNTER — Emergency Department (HOSPITAL_COMMUNITY)
Admission: EM | Admit: 2022-08-11 | Discharge: 2022-08-11 | Disposition: A | Discharge status: Home / Self Care | Payer: Self-pay | Attending: Emergency Medicine | Admitting: Emergency Medicine

## 2022-08-11 ENCOUNTER — Other Ambulatory Visit: Payer: Self-pay

## 2022-08-11 DIAGNOSIS — X101XXA Contact with hot food, initial encounter: Secondary | ICD-10-CM | POA: Insufficient documentation

## 2022-08-11 DIAGNOSIS — Z7951 Long term (current) use of inhaled steroids: Secondary | ICD-10-CM | POA: Insufficient documentation

## 2022-08-11 DIAGNOSIS — T280XXA Burn of mouth and pharynx, initial encounter: Secondary | ICD-10-CM | POA: Insufficient documentation

## 2022-08-11 DIAGNOSIS — J45909 Unspecified asthma, uncomplicated: Secondary | ICD-10-CM | POA: Insufficient documentation

## 2022-08-11 DIAGNOSIS — Y9289 Other specified places as the place of occurrence of the external cause: Secondary | ICD-10-CM | POA: Insufficient documentation

## 2022-08-11 MED ORDER — OXYCODONE HCL 5 MG PO TABS
5.0000 mg | ORAL_TABLET | Freq: Once | ORAL | Status: AC
  Administered 2022-08-11: 5 mg via ORAL
  Filled 2022-08-11: qty 1

## 2022-08-11 MED ORDER — HYDROCODONE-ACETAMINOPHEN 5-325 MG PO TABS
1.0000 | ORAL_TABLET | ORAL | 0 refills | Status: DC | PRN
Start: 2022-08-11 — End: 2023-01-22

## 2022-08-11 NOTE — ED Provider Notes (Signed)
[facility] EMERGENCY DEPARTMENT Provider Note   CSN: [id number] Arrival date & time: 08/11/22  0407     History  Chief Complaint  Patient presents with   Burn    Sue Scott is a 21 y.o. female.  The history is provided by the patient.  Burn She has a history of asthma, GERD, polycystic ovarian syndrome and comes in because she burned the roof of her mouth when eating hot meatballs tonight.  She has taken ibuprofen and acetaminophen for pain but not received adequate relief.  She denies other injury.   Home Medications Prior to Admission medications   Medication Sig Start Date End Date Taking? Authorizing Provider  HYDROcodone-acetaminophen (NORCO) 5-325 MG tablet Take 1 tablet by mouth every 4 (four) hours as needed for moderate pain. 08/11/22  Yes Dione Booze, MD  albuterol (VENTOLIN HFA) 108 (90 Base) MCG/ACT inhaler Inhale 1-2 puffs into the lungs every 4 (four) hours as needed for wheezing or shortness of breath. 01/27/22   Zenia Resides, MD  Ascorbic Acid (VITAMIN C) 100 MG tablet Take 100 mg by mouth daily.    [provider]  Boric Acid Vaginal 600 MG SUPP Place vaginally.    [provider]  Cyanocobalamin (B-12 PO) Take by mouth.    [provider]  escitalopram (LEXAPRO) 10 MG tablet Take 1 tablet (10 mg total) by mouth daily. 03/15/22   Verneda Skill, FNP  FENUGREEK PO Take by mouth.    [provider]  Ferrous Gluconate (IRON 27 PO) Take by mouth.    [provider]  Lactobacillus (ACIDOPHILUS PO) Take by mouth.    [provider]  Misc Natural Products (CRANBERRY/PROBIOTIC PO) Take by mouth.    [provider]  multivitamin-iron-minerals-folic acid (CENTRUM) chewable tablet Chew 1 tablet by mouth daily. 08/21/21   Georges Mouse, NP  norgestimate-ethinyl estradiol (SPRINTEC 28) 0.25-35 MG-MCG tablet Take 1 tablet daily. Discard placebos and take active pills for continuous cycling 03/04/22    Georges Mouse, NP  Omega-3 Fatty Acids (OMEGA-3 EPA FISH OIL PO) Take by mouth.    [provider]  spironolactone (ALDACTONE) 25 MG tablet Take 1 tablet (25 mg total) by mouth daily. 03/04/22   Shropshire, Beatriz, DO  Turmeric (QC TUMERIC COMPLEX PO) Take by mouth.    [provider]      Allergies    Other    Review of Systems   Review of Systems  All other systems reviewed and are negative.   Physical Exam Updated Vital Signs BP 110/70 (BP Location: Left Arm)   Pulse 81   Temp 97.9 F (36.6 C) (Oral)   Resp 18   Ht 5' 3.5" (1.613 m)   Wt 79.4 kg   SpO2 99%   BMI 30.51 kg/m  Physical Exam Vitals and nursing note reviewed.   21 year old female, resting comfortably and in no acute distress. Vital signs are normal. Oxygen saturation is 99%, which is normal. Head is normocephalic and atraumatic. PERRLA, EOMI. Oropharynx shows partial-thickness burn of the hard palate.  She is not having any difficulty with secretions. Neck is nontender and supple without adenopathy. Lungs are clear without rales, wheezes, or rhonchi. Chest is nontender. Heart has regular rate and rhythm without murmur. Abdomen is soft, flat, nontender. Skin is warm and dry without rash. Neurologic: Mental status is normal, cranial nerves are intact, moves all extremities equally.  ED Results / Procedures / Treatments   Labs (all  labs ordered are listed, but only abnormal results are displayed) Labs Reviewed - No data to display  EKG None  Radiology No results found.  Procedures Procedures    Medications Ordered in ED Medications  oxyCODONE (Oxy IR/ROXICODONE) immediate release tablet 5 mg (has no administration in time range)    ED Course/ Medical Decision Making/ A&P                           Medical Decision Making Risk Prescription drug management.   Partial-thickness burn of the hard palate.  I have advised her to apply cold items such as popsicles and I am  giving her a take-home pack of hydrocodone-acetaminophen for pain not relieved with over-the-counter NSAIDs plus acetaminophen.  Precautions are explained to make sure she does not accidentally overdose on acetaminophen.  Final Clinical Impression(s) / ED Diagnoses Final diagnoses:  Mouth burn, initial encounter    Rx / DC Orders ED Discharge Orders          Ordered    HYDROcodone-acetaminophen (NORCO) 5-325 MG tablet  Every 4 hours PRN        08/11/22 0520              Dione Booze, MD 08/11/22 [phone]

## 2022-08-11 NOTE — ED Triage Notes (Signed)
Pt arrived via POV from home c/o burns to roof of her mouth sustained 3 days ago after eating some hot meatballs. Pt endorses difficulty sleeping, eating and drinking.

## 2022-08-11 NOTE — ED Triage Notes (Signed)
Pt reports last taking Ibuprofen at 0350 PTA and Tylenol at 0230 PTA this morning for pain.

## 2022-08-11 NOTE — Discharge Instructions (Signed)
Apply ice as needed.  Take ibuprofen or acetaminophen as needed for less severe pain.  You may combine ibuprofen and acetaminophen to get additional pain relief.  You may take the hydrocodone-acetaminophen if you need additional pain relief, but do not take the hydrocodone-acetaminophen less than 4 hours after taking acetaminophen.  Also, do not take acetaminophen any sooner than 4 hours after taking the hydrocodone-acetaminophen.  Make sure that hot food has cooled through an acceptable temperature before eating.

## 2022-08-12 MED FILL — Hydrocodone-Acetaminophen Tab 5-325 MG: ORAL | Qty: 6 | Status: AC

## 2022-09-10 ENCOUNTER — Ambulatory Visit
Admission: EM | Admit: 2022-09-10 | Discharge: 2022-09-10 | Disposition: A | Discharge status: Home / Self Care | Payer: Self-pay | Attending: Nurse Practitioner | Admitting: Nurse Practitioner

## 2022-09-10 DIAGNOSIS — R21 Rash and other nonspecific skin eruption: Secondary | ICD-10-CM

## 2022-09-10 MED ORDER — PREDNISONE 20 MG PO TABS: 40.0000 mg | ORAL_TABLET | Freq: Every day | ORAL | 0 refills | Status: AC

## 2022-09-10 MED ORDER — TRIAMCINOLONE ACETONIDE 0.1 % EX CREA: 1.0000 | TOPICAL_CREAM | Freq: Two times a day (BID) | CUTANEOUS | 0 refills | Status: DC

## 2022-09-10 NOTE — ED Triage Notes (Signed)
Patient present to UC for generalized body rash x 2 weeks. States no changes in diet, meds, or products. Denies fever. Has been using hydrocortisone cream. Hx of psoriasis.

## 2022-09-10 NOTE — Discharge Instructions (Signed)
Take medication as prescribed. May also take over-the-counter Zyrtec to help with itching. Avoid hot baths or showers while symptoms persist.  Recommend taking lukewarm baths. May apply cool cloths to the area to help with itching or discomfort. Avoid scratching, rubbing, or manipulating the areas while symptoms persist. Recommend Aveeno colloidal oatmeal bath to use to help with drying and itching. As discussed, if symptoms recur, consider following up with your primary care physician or with dermatology for further evaluation. Follow-up as needed.

## 2022-09-10 NOTE — ED Provider Notes (Signed)
[facility]    CSN: [id number] Arrival date & time: 09/10/22  1031      History   Chief Complaint Chief Complaint  Patient presents with   Rash    HPI Sue Scott is a 21 y.o. female.   The history is provided by the patient.   The patient presents for complaints of rash has been present for the past 2 weeks.  Patient states rash is itchy, and is located to her chest, trunk, and groin areas.  She denies fever, chills, drainage, or oozing.  She also denies foods, medications, soaps, detergents, or lotions.  Patient reports that she does have a history of psoriasis.  She states she has been using hydrocortisone cream for her symptoms. 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      Home Medications    Prior to Admission medications   Medication Sig Start Date End Date Taking? Authorizing Provider  predniSONE (DELTASONE) 20 MG tablet Take 2 tablets (40 mg total) by mouth daily with breakfast for 5 days. 09/10/22 09/15/22 Yes Ovila Lepage-Warren, Sadie Haber, NP  triamcinolone cream (KENALOG) 0.1 % Apply 1 Application topically 2 (two) times daily. 09/10/22  Yes Blonnie Maske-Warren, Sadie Haber, NP  albuterol (VENTOLIN HFA) 108 (90 Base) MCG/ACT inhaler Inhale 1-2 puffs into the lungs  every 4 (four) hours as needed for wheezing or shortness of breath. 01/27/22   Zenia Resides, MD  Ascorbic Acid (VITAMIN C) 100 MG tablet Take 100 mg by mouth daily.    [provider]  Boric Acid Vaginal 600 MG SUPP Place vaginally.    [provider]  Cyanocobalamin (B-12 PO) Take by mouth.    [provider]  escitalopram (LEXAPRO) 10 MG tablet Take 1 tablet (10 mg total) by mouth daily. 03/15/22   Verneda Skill, FNP  FENUGREEK PO Take by mouth.    [provider]  Ferrous Gluconate (IRON 27 PO) Take by mouth.    [provider]  HYDROcodone-acetaminophen (NORCO) 5-325 MG tablet Take 1 tablet by mouth every 4 (four) hours as needed for moderate pain. 08/11/22   Dione Booze, MD  Lactobacillus (ACIDOPHILUS PO) Take by mouth.    [provider]  Misc Natural Products (CRANBERRY/PROBIOTIC PO) Take by mouth.    [provider]  multivitamin-iron-minerals-folic acid (CENTRUM) chewable tablet Chew 1 tablet by mouth daily. 08/21/21   Georges Mouse, NP  norgestimate-ethinyl estradiol (SPRINTEC 28) 0.25-35 MG-MCG tablet Take 1 tablet daily. Discard placebos and take active pills for continuous cycling 03/04/22   Georges Mouse, NP  Omega-3 Fatty Acids (OMEGA-3 EPA FISH OIL PO) Take by mouth.    [provider]  spironolactone (ALDACTONE) 25 MG tablet Take 1 tablet (  25 mg total) by mouth daily. 03/04/22   Shropshire, Beatriz, DO  Turmeric (QC TUMERIC COMPLEX PO) Take by mouth.    [provider]    Family History Family History  Problem Relation Age of Onset   Healthy Mother    Healthy Father    Asthma Maternal Aunt    Crohn's disease Maternal Aunt    Asthma Maternal Grandmother    Anxiety disorder Maternal Grandmother    Depression Maternal Grandmother    Anxiety disorder Paternal Grandmother    Depression Paternal Grandmother    Lung disease Paternal Grandfather    Pulmonary fibrosis Paternal  Grandfather     Social History Social History   Tobacco Use   Smoking status: Never    Passive exposure: Yes   Smokeless tobacco: Never  Vaping Use   Vaping Use: Never used  Substance Use Topics   Alcohol use: No   Drug use: No     Allergies   Other   Review of Systems Review of Systems Per HPI  Physical Exam Triage Vital Signs ED Triage Vitals  Enc Vitals Group     BP 09/10/22 1100 105/64     Pulse Rate 09/10/22 1100 77     Resp 09/10/22 1100 16     Temp 09/10/22 1100 98.2 F (36.8 C)     Temp Source 09/10/22 1100 Oral     SpO2 09/10/22 1100 98 %     Weight --      Height --      Head Circumference --      Peak Flow --      Pain Score 09/10/22 1103 0     Pain Loc --      Pain Edu? --      Excl. in [place]? --    No data found.  Updated Vital Signs BP 105/64 (BP Location: Left Arm)   Pulse 77   Temp 98.2 F (36.8 C) (Oral)   Resp 16   SpO2 98%   Visual Acuity Right Eye Distance:   Left Eye Distance:   Bilateral Distance:    Right Eye Near:   Left Eye Near:    Bilateral Near:     Physical Exam Vitals and nursing note reviewed.  Constitutional:      Appearance: Normal appearance.  HENT:     Head: Normocephalic.  Eyes:     Extraocular Movements: Extraocular movements intact.     Pupils: Pupils are equal, round, and reactive to light.  Pulmonary:     Effort: Pulmonary effort is normal.  Musculoskeletal:     Cervical back: Normal range of motion.  Lymphadenopathy:     Cervical: No cervical adenopathy.  Skin:    General: Skin is warm and dry.     Findings: Rash present.     Comments: Patient with maculopapular rash noted to the chest, trunk, and groin area.  Areas are raised, with erythematous patches noted throughout.  There is no current groin pattern or consistency with the rash.  There is no oozing, fluctuance, or drainage present.  Neurological:     General: No focal deficit present.     Mental Status: She is alert and oriented to  person, place, and time.  Psychiatric:        Mood and Affect: Mood normal.        Behavior: Behavior normal.      UC Treatments / Results  Labs (all labs ordered are listed, but only abnormal results are  displayed) Labs Reviewed - No data to display  EKG   Radiology No results found.  Procedures Procedures (including critical care time)  Medications Ordered in UC Medications - No data to display  Initial Impression / Assessment and Plan / UC Course  I have reviewed the triage vital signs and the nursing notes.  Pertinent labs & imaging results that were available during my care of the patient were reviewed by me and considered in my medical decision making (see chart for details).  The patient is well-appearing, she is in no acute distress, vital signs are stable.  Patient presents with rash that has been present for the past 2 weeks.  Difficult to ascertain the cause of the patient's rash.  Will treat with prednisone 40 mg for 5 days and triamcinolone cream 0.1% at this time.  Supportive care recommendations were provided to the patient along with consideration for follow-up to dermatology if symptoms continue to persist.  Patient verbalizes understanding.  All questions were answered.  Patient is stable for discharge.  Final Clinical Impressions(s) / UC Diagnoses   Final diagnoses:  Rash and nonspecific skin eruption     Discharge Instructions      Take medication as prescribed. May also take over-the-counter Zyrtec to help with itching. Avoid hot baths or showers while symptoms persist.  Recommend taking lukewarm baths. May apply cool cloths to the area to help with itching or discomfort. Avoid scratching, rubbing, or manipulating the areas while symptoms persist. Recommend Aveeno colloidal oatmeal bath to use to help with drying and itching. As discussed, if symptoms recur, consider following up with your primary care physician or with dermatology for further  evaluation. Follow-up as needed.     ED Prescriptions     Medication Sig Dispense Auth. Provider   predniSONE (DELTASONE) 20 MG tablet Take 2 tablets (40 mg total) by mouth daily with breakfast for 5 days. 10 tablet Lennox Leikam-Warren, Alda Lea, NP   triamcinolone cream (KENALOG) 0.1 % Apply 1 Application topically 2 (two) times daily. 45 g Tionne Dayhoff-Warren, Alda Lea, NP      PDMP not reviewed this encounter.   Tish Men, NP 09/10/22 1135

## 2022-11-04 ENCOUNTER — Ambulatory Visit
Admission: EM | Admit: 2022-11-04 | Discharge: 2022-11-04 | Disposition: A | Discharge status: Home / Self Care | Payer: Self-pay | Attending: Internal Medicine | Admitting: Internal Medicine

## 2022-11-04 ENCOUNTER — Encounter: Payer: Self-pay | Admitting: *Deleted

## 2022-11-04 DIAGNOSIS — S61309A Unspecified open wound of unspecified finger with damage to nail, initial encounter: Secondary | ICD-10-CM | POA: Insufficient documentation

## 2022-11-04 DIAGNOSIS — J029 Acute pharyngitis, unspecified: Secondary | ICD-10-CM | POA: Insufficient documentation

## 2022-11-04 LAB — POCT RAPID STREP A (OFFICE): Rapid Strep A Screen: NEGATIVE

## 2022-11-04 LAB — POCT MONO SCREEN (KUC): Mono, POC: NEGATIVE

## 2022-11-04 MED ORDER — CEPHALEXIN 500 MG PO CAPS: 500.0000 mg | ORAL_CAPSULE | Freq: Two times a day (BID) | ORAL | 0 refills | Status: DC

## 2022-11-04 NOTE — ED Triage Notes (Signed)
Pt states she has had a sore throat x 1.5 week and her left eye is red. She states she also pulled her finger nails off of her left middle and ring finger. She has been taking nose spray, Ibu and dayquil.

## 2022-11-04 NOTE — Discharge Instructions (Signed)
Rapid strep and rapid mono were negative.  Throat culture is pending.  Will call if it is abnormal.  I have prescribed an antibiotic that will treat bacteria in your throat given the way that it looks on exam.  This antibiotic will also treat finger infection.  Recommend applying Neosporin to affected area that is red and swollen on finger.  May be beneficial to keep covered with a Band-Aid as well.  Follow-up if any symptoms persist or worsen.

## 2022-11-04 NOTE — ED Provider Notes (Signed)
[facility]    CSN: [id number] Arrival date & time: 11/04/22  0849      History   Chief Complaint Chief Complaint  Patient presents with   Sore Throat   Conjunctivitis   finger nail injury    HPI Sue Scott is a 22 y.o. female.   Patient presents with several different chief complaints today.  Patient reports sore throat that has been present for 1.5 weeks.  Reports some mild nasal congestion associated with it.  Denies cough, runny nose, chest pain, shortness of breath, nausea, vomiting, diarrhea, abdominal pain.  Reports known sick contacts.  Denies any known fevers.  Patient also reporting some irritation to the surrounding the left eye that started around the same time.  Denies trauma or foreign body to the eye.  Denies drainage from the eye.  Patient does not wear contacts or glasses.  Denies any vision changes.  Patient also reporting swelling and pain to third and fourth fingers after injury.  Patient states that she had acrylic fingernails on when she went to lift a box.  She dropped the box causing the third and fourth fingernails to be ripped off.  Patient reports that she is noticing redness and swelling to the fourth digit and is concerned for infection.  Denies any drainage from the area.  Denies any bleeding.   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      Home Medications    Prior to Admission medications   Medication Sig Start Date End Date Taking? Authorizing Provider  cephALEXin (KEFLEX) 500 MG capsule Take 1 capsule (500 mg total) by mouth 2 (two) times daily for 10 days. 11/04/22 11/14/22 Yes Maeson Purohit, Michele Rockers, FNP  albuterol (VENTOLIN HFA) 108 (90 Base) MCG/ACT inhaler Inhale 1-2 puffs into the lungs every 4 (four) hours as needed for wheezing or shortness of breath. 01/27/22   Barrett Henle, MD  Ascorbic Acid (VITAMIN C) 100 MG tablet Take 100 mg by mouth daily.    [provider]  Boric Acid Vaginal 600 MG SUPP Place vaginally.    [provider]  Cyanocobalamin (B-12 PO) Take by mouth.    [provider]  escitalopram (LEXAPRO) 10 MG tablet Take 1 tablet (10 mg total) by mouth daily. 03/15/22   Trude Mcburney, FNP  FENUGREEK PO Take by mouth.    [provider]  Ferrous Gluconate (IRON 27 PO) Take by mouth.    [provider]  HYDROcodone-acetaminophen (NORCO) 5-325 MG tablet Take 1 tablet by mouth every 4 (four) hours as needed for moderate pain. [id number]   Delora Fuel, MD  Lactobacillus (ACIDOPHILUS PO) Take by mouth.  [provider]  Misc Natural Products (CRANBERRY/PROBIOTIC PO) Take by mouth.    [provider]  multivitamin-iron-minerals-folic acid (CENTRUM) chewable tablet Chew 1 tablet by mouth daily. 08/21/21   Parthenia Ames, NP  norgestimate-ethinyl estradiol (SPRINTEC 28) 0.25-35 MG-MCG tablet Take 1 tablet daily. Discard placebos and take active pills for continuous cycling 03/04/22   Parthenia Ames, NP  Omega-3 Fatty Acids (OMEGA-3 EPA FISH OIL PO) Take by mouth.    [provider]  spironolactone (ALDACTONE) 25 MG tablet Take 1 tablet (25 mg total) by mouth daily. 03/04/22   Shropshire, Beatriz, DO   triamcinolone cream (KENALOG) 0.1 % Apply 1 Application topically 2 (two) times daily. 09/10/22   Leath-Warren, Alda Lea, NP  Turmeric (QC TUMERIC COMPLEX PO) Take by mouth.    [provider]    Family History Family History  Problem Relation Age of Onset   Healthy Mother    Healthy Father    Asthma Maternal Grandmother    Anxiety disorder Maternal Grandmother    Depression Maternal Grandmother    Anxiety disorder Paternal Grandmother    Depression Paternal Grandmother    Lung disease Paternal Grandfather    Pulmonary fibrosis Paternal Grandfather    Asthma Maternal Aunt    Crohn's disease Maternal Aunt     Social History Social History   Tobacco Use   Smoking status: Never    Passive exposure: Yes   Smokeless tobacco: Never  Vaping Use   Vaping Use: Never used  Substance Use Topics   Alcohol use: Yes    Comment: rare   Drug use: Yes    Types: Marijuana     Allergies   Justicia adhatoda (malabar nut tree) [justicia adhatoda] and Other   Review of Systems Review of Systems Per HPI  Physical Exam Triage Vital Signs ED Triage Vitals  Enc Vitals Group     BP 11/04/22 0908 121/80     Pulse Rate 11/04/22 0908 79     Resp 11/04/22 0908 18     Temp 11/04/22 0908 97.9 F (36.6 C)     Temp Source 11/04/22 0908 Oral     SpO2 11/04/22 0908 97 %     Weight --      Height --      Head Circumference --      Peak Flow --      Pain Score 11/04/22 0906 10     Pain Loc --      Pain Edu? --      Excl. in [place]? --    No data found.  Updated Vital Signs BP 121/80 (BP Location: Left Arm)   Pulse 79   Temp 97.9 F (36.6 C) (Oral)   Resp 18   SpO2 97%   Visual Acuity Right Eye Distance:   Left Eye Distance:   Bilateral Distance:    Right Eye Near:   Left Eye Near:    Bilateral Near:     Physical Exam Constitutional:      General: She is not in acute distress.    Appearance: Normal appearance. She is not toxic-appearing or diaphoretic.  HENT:      Head: Normocephalic and atraumatic.     Right Ear: Tympanic membrane and ear canal normal.     Left Ear: Tympanic membrane and ear canal normal.     Nose: Congestion present.     Mouth/Throat:     Mouth: Mucous membranes are moist.     Pharynx: Posterior  oropharyngeal erythema present. No pharyngeal swelling, oropharyngeal exudate or uvula swelling.     Tonsils: No tonsillar exudate or tonsillar abscesses. 1+ on the right. 1+ on the left.  Eyes:     General: Lids are normal. Lids are everted, no foreign bodies appreciated. Vision grossly intact. Gaze aligned appropriately.     Extraocular Movements: Extraocular movements intact.     Conjunctiva/sclera: Conjunctivae normal.     Pupils: Pupils are equal, round, and reactive to light.      Comments: Patient has very small area of erythema present to skin at corner of left eye.  Eyeball and conjunctive appears normal.  No drainage noted.  Cardiovascular:     Rate and Rhythm: Normal rate and regular rhythm.     Pulses: Normal pulses.     Heart sounds: Normal heart sounds.  Pulmonary:     Effort: Pulmonary effort is normal. No respiratory distress.     Breath sounds: Normal breath sounds. No wheezing.  Abdominal:     General: Abdomen is flat. Bowel sounds are normal.     Palpations: Abdomen is soft.  Musculoskeletal:        General: Normal range of motion.     Cervical back: Normal range of motion.  Skin:    General: Skin is warm and dry.     Comments: Patient is completely missing third and fourth fingernails on the left hand.  Nailbed is healed and appears normal. There is some mild erythema and swelling surrounding the cuticle of the left fourth digit.  No drainage noted.  No area of fluctuance.  Capillary refill and pulses are intact.  Patient has full range of motion of fingers.  Neurological:     General: No focal deficit present.     Mental Status: She is alert and oriented to person, place, and time. Mental status is at  baseline.  Psychiatric:        Mood and Affect: Mood normal.        Behavior: Behavior normal.      UC Treatments / Results  Labs (all labs ordered are listed, but only abnormal results are displayed) Labs Reviewed  POCT RAPID STREP A (OFFICE) - Normal  POCT MONO SCREEN ([facility]) - Normal  CULTURE, GROUP A STREP [facility])    EKG   Radiology No results found.  Procedures Procedures (including critical care time)  Medications Ordered in UC Medications - No data to display  Initial Impression / Assessment and Plan / UC Course  I have reviewed the triage vital signs and the nursing notes.  Pertinent labs & imaging results that were available during my care of the patient were reviewed by me and considered in my medical decision making (see chart for details).     1.  Sore throat Strep and rapid mono were negative.  Throat culture is pending.  I am still highly suspicious of bacteria in throat given appearance of posterior pharynx on exam so will opt to treat with antibiotic.  Discussed with patient that viral illness could be etiology as well.  Patient verbalized understanding.  2.  Eye irritation Patient has mild erythema present to outside of the eye.  No concern for bacterial or fungal infection at this time.  It appears to be irritation possibly from tears.  No concern for conjunctivitis at this time.  Eyeball appears normal.  Advised patient to follow-up if this persist or worsens.  Discussed supportive care.  3.  Nail avulsion Patient has nail avulsion  that is complete to third and fourth fingers of left hand.  The fourth digit is mildly inflamed with mild suspicion of infection.  May just be inflamed from injury. Antibiotic prescribed for sore throat which cephalexin should cover for both bacteria in throat and skin infection.  Also advised applying Neosporin to area.  Nailbed is healed and no other signs of infection.  Advised topical antibiotic as well. Advised patient to  monitor all symptoms today and follow-up if any symptoms persist or worsen.  Patient verbalized understanding and was agreeable with plan. Final Clinical Impressions(s) / UC Diagnoses   Final diagnoses:  Sore throat  Avulsion of fingernail of left hand     Discharge Instructions      Rapid strep and rapid mono were negative.  Throat culture is pending.  Will call if it is abnormal.  I have prescribed an antibiotic that will treat bacteria in your throat given the way that it looks on exam.  This antibiotic will also treat finger infection.  Recommend applying Neosporin to affected area that is red and swollen on finger.  May be beneficial to keep covered with a Band-Aid as well.  Follow-up if any symptoms persist or worsen.    ED Prescriptions     Medication Sig Dispense Auth. Provider   cephALEXin (KEFLEX) 500 MG capsule Take 1 capsule (500 mg total) by mouth 2 (two) times daily for 10 days. 20 capsule Teodora Medici, [place]      PDMP not reviewed this encounter.   Teodora Medici, [place] 11/04/22 [phone]

## 2022-11-05 ENCOUNTER — Ambulatory Visit: Payer: Self-pay

## 2022-11-06 LAB — CULTURE, GROUP A STREP (THRC)

## 2022-11-11 ENCOUNTER — Encounter: Payer: Self-pay | Admitting: Emergency Medicine

## 2022-11-11 ENCOUNTER — Ambulatory Visit
Admission: EM | Admit: 2022-11-11 | Discharge: 2022-11-11 | Disposition: A | Discharge status: Home / Self Care | Payer: Self-pay | Attending: Family Medicine | Admitting: Family Medicine

## 2022-11-11 ENCOUNTER — Other Ambulatory Visit: Payer: Self-pay

## 2022-11-11 DIAGNOSIS — J020 Streptococcal pharyngitis: Secondary | ICD-10-CM

## 2022-11-11 LAB — POCT RAPID STREP A (OFFICE): Rapid Strep A Screen: POSITIVE — AB

## 2022-11-11 MED ORDER — AMOXICILLIN 875 MG PO TABS: 875.0000 mg | ORAL_TABLET | Freq: Two times a day (BID) | ORAL | 0 refills | Status: AC

## 2022-11-11 NOTE — Discharge Instructions (Signed)
Strep test is positive 2 days into taking the antibiotics, throw away the toothbrush and begin using a new one. Patient is not contagious after 24 hours of antibiotics; a full 10 days should be completed to prevent rheumatic fever  Take amoxicillin 875 mg--1 tab twice daily for 10 days

## 2022-11-11 NOTE — ED Triage Notes (Signed)
Pt here for URI sx with congestion and body aches and sore throat starting this am

## 2022-11-11 NOTE — ED Provider Notes (Signed)
[facility]    CSN: [id number] Arrival date & time: 11/11/22  1905      History   Chief Complaint Chief Complaint  Patient presents with   URI    HPI Sue Scott is a 22 y.o. female.    URI  Here for sore throat and congestion and myalgia.  Symptoms began yesterday.  She was recently treated with Keflex for possible skin infection around her nails.  Rapid strep and mono testing were negative at that time.  She had not completed the course, and then took 1 dose of it today when her throat was hurting.  2 hours ago.  No fever noted.  She has been exposed to a friend with strep and a friend with flu.  These exposures were 1 to 2 days before she started having symptoms.  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      Home Medications    Prior to Admission medications   Medication Sig Start Date End Date Taking? Authorizing Provider  amoxicillin (AMOXIL) 875 MG tablet Take 1 tablet (875 mg total) by mouth 2 (two) times daily for 10 days. 11/11/22 11/21/22 Yes Jilleen Essner, Gwenlyn Perking, MD  albuterol (VENTOLIN HFA) 108 (90 Base) MCG/ACT inhaler Inhale 1-2 puffs into the lungs every 4 (four) hours as needed for wheezing or shortness of breath. 01/27/22   Barrett Henle, MD   Ascorbic Acid (VITAMIN C) 100 MG tablet Take 100 mg by mouth daily.    [provider]  Boric Acid Vaginal 600 MG SUPP Place vaginally.    [provider]  Cyanocobalamin (B-12 PO) Take by mouth.    [provider]  escitalopram (LEXAPRO) 10 MG tablet Take 1 tablet (10 mg total) by mouth daily. 03/15/22   Trude Mcburney, FNP  FENUGREEK PO Take by mouth.    [provider]  Ferrous Gluconate (IRON 27 PO) Take by mouth.    [provider]  HYDROcodone-acetaminophen (NORCO) 5-325 MG tablet Take 1 tablet by mouth every 4 (four) hours as needed for moderate pain. [id number]   Delora Fuel, MD  Lactobacillus (ACIDOPHILUS PO) Take by mouth.    [provider]  Misc Natural Products (CRANBERRY/PROBIOTIC PO) Take by mouth.    [provider]  multivitamin-iron-minerals-folic acid (CENTRUM) chewable tablet Chew 1 tablet by mouth daily. 08/21/21   Parthenia Ames, NP  norgestimate-ethinyl estradiol (SPRINTEC 28) 0.25-35 MG-MCG tablet Take 1 tablet daily. Discard placebos and take active pills for continuous cycling 03/04/22   Parthenia Ames, NP  Omega-3 Fatty Acids (OMEGA-3 EPA FISH OIL PO) Take by mouth.    [provider]  spironolactone (ALDACTONE) 25 MG tablet Take 1 tablet (25 mg total) by  mouth daily. 03/04/22   Shropshire, Beatriz, DO  triamcinolone cream (KENALOG) 0.1 % Apply 1 Application topically 2 (two) times daily. 09/10/22   Leath-Warren, Alda Lea, NP  Turmeric (QC TUMERIC COMPLEX PO) Take by mouth.    [provider]    Family History Family History  Problem Relation Age of Onset   Healthy Mother    Healthy Father    Asthma Maternal Grandmother    Anxiety disorder Maternal Grandmother    Depression Maternal Grandmother    Anxiety disorder Paternal Grandmother    Depression Paternal Grandmother    Lung disease Paternal Grandfather    Pulmonary fibrosis Paternal Grandfather    Asthma Maternal Aunt     Crohn's disease Maternal Aunt     Social History Social History   Tobacco Use   Smoking status: Never    Passive exposure: Yes   Smokeless tobacco: Never  Vaping Use   Vaping Use: Never used  Substance Use Topics   Alcohol use: Yes    Comment: rare   Drug use: Yes    Types: Marijuana     Allergies   Justicia adhatoda (malabar nut tree) [justicia adhatoda] and Other   Review of Systems Review of Systems   Physical Exam Triage Vital Signs ED Triage Vitals [11/11/22 1938]  Enc Vitals Group     BP 100/63     Pulse Rate 95     Resp 18     Temp 97.7 F (36.5 C)     Temp Source Oral     SpO2 97 %     Weight      Height      Head Circumference      Peak Flow      Pain Score 0     Pain Loc      Pain Edu?      Excl. in [place]?    No data found.  Updated Vital Signs BP 100/63 (BP Location: Left Arm)   Pulse 95   Temp 97.7 F (36.5 C) (Oral)   Resp 18   SpO2 97%   Visual Acuity Right Eye Distance:   Left Eye Distance:   Bilateral Distance:    Right Eye Near:   Left Eye Near:    Bilateral Near:     Physical Exam Vitals reviewed.  Constitutional:      General: She is not in acute distress.    Appearance: She is not toxic-appearing.  HENT:     Right Ear: Tympanic membrane and ear canal normal.     Left Ear: Tympanic membrane and ear canal normal.     Nose: Nose normal.     Mouth/Throat:     Mouth: Mucous membranes are moist.     Comments: There is erythema and clear mucus in the oropharynx. Eyes:     Extraocular Movements: Extraocular movements intact.     Conjunctiva/sclera: Conjunctivae normal.     Pupils: Pupils are equal, round, and reactive to light.  Cardiovascular:     Rate and Rhythm: Normal rate and regular rhythm.     Heart sounds: No murmur heard. Pulmonary:     Effort: Pulmonary effort is normal. No respiratory distress.     Breath sounds: No stridor. No wheezing, rhonchi or rales.  Musculoskeletal:     Cervical back: Neck supple.   Lymphadenopathy:     Cervical: No cervical adenopathy.  Skin:    Capillary Refill: Capillary refill takes less than 2 seconds.  Coloration: Skin is not jaundiced or pale.  Neurological:     General: No focal deficit present.     Mental Status: She is alert and oriented to person, place, and time.  Psychiatric:        Behavior: Behavior normal.      UC Treatments / Results  Labs (all labs ordered are listed, but only abnormal results are displayed) Labs Reviewed  POCT RAPID STREP A (OFFICE) - Abnormal; Notable for the following components:      Result Value   Rapid Strep A Screen Positive (*)    All other components within normal limits    EKG   Radiology No results found.  Procedures Procedures (including critical care time)  Medications Ordered in UC Medications - No data to display  Initial Impression / Assessment and Plan / UC Course  I have reviewed the triage vital signs and the nursing notes.  Pertinent labs & imaging results that were available during my care of the patient were reviewed by me and considered in my medical decision making (see chart for details).     Strep is positive this time.  Amoxicillin for 10 days is sent in.  I have discussed with her the importance of completing all 10 days of therapy. Final Clinical Impressions(s) / UC Diagnoses   Final diagnoses:  Strep pharyngitis     Discharge Instructions      Strep test is positive 2 days into taking the antibiotics, throw away the toothbrush and begin using a new one. Patient is not contagious after 24 hours of antibiotics; a full 10 days should be completed to prevent rheumatic fever  Take amoxicillin 875 mg--1 tab twice daily for 10 days      ED Prescriptions     Medication Sig Dispense Auth. Provider   amoxicillin (AMOXIL) 875 MG tablet Take 1 tablet (875 mg total) by mouth 2 (two) times daily for 10 days. 20 tablet Oriya Kettering, Gwenlyn Perking, MD      PDMP not reviewed this  encounter.   Barrett Henle, MD 11/11/22 2004

## 2023-01-22 ENCOUNTER — Telehealth (INDEPENDENT_AMBULATORY_CARE_PROVIDER_SITE_OTHER): Payer: Self-pay | Admitting: Family

## 2023-01-22 ENCOUNTER — Encounter: Payer: Self-pay | Admitting: Family

## 2023-01-22 DIAGNOSIS — N946 Dysmenorrhea, unspecified: Secondary | ICD-10-CM

## 2023-01-22 DIAGNOSIS — N911 Secondary amenorrhea: Secondary | ICD-10-CM

## 2023-01-22 DIAGNOSIS — E282 Polycystic ovarian syndrome: Secondary | ICD-10-CM

## 2023-01-22 DIAGNOSIS — N926 Irregular menstruation, unspecified: Secondary | ICD-10-CM

## 2023-01-22 DIAGNOSIS — E559 Vitamin D deficiency, unspecified: Secondary | ICD-10-CM

## 2023-01-22 DIAGNOSIS — R102 Pelvic and perineal pain: Secondary | ICD-10-CM

## 2023-01-22 MED ORDER — NAPROXEN 500 MG PO TABS: 500.0000 mg | ORAL_TABLET | Freq: Two times a day (BID) | ORAL | 0 refills | Status: AC

## 2023-01-22 NOTE — Progress Notes (Signed)
THIS RECORD MAY CONTAIN CONFIDENTIAL INFORMATION THAT SHOULD NOT BE RELEASED WITHOUT REVIEW OF THE SERVICE PROVIDER.  Virtual Follow-Up Visit via Video Note  I connected with Sue Scott  on 01/22/23 at 10:30 AM EDT by a video enabled telemedicine application and verified that I am speaking with the correct person using two identifiers.   Patient/parent location: home  Provider location: remote [place]   I discussed the limitations of evaluation and management by telemedicine and the availability of in person appointments.  I discussed that the purpose of this telehealth visit is to provide medical care while limiting exposure to the novel coronavirus.  The patient expressed understanding and agreed to proceed.   Sue Scott is a 22 y.o. female referred by No ref. provider found here today for follow-up of PCOS, cramping.   History was provided by the patient.  Supervising Physician: Dr. Theadore Nan   Plan from Last Visit:   Sue Scott presents today with boyfriend Sue Scott for f/u of anxiety and depression; Lexapro initiated 6/5. Additionally to discuss PCOS labs, specifically elevated testosterone. Complete PCOS and metabolic panel obtained last visit with results notable for testosterone of 56. Sue Scott is interested in starting Spironolactone and birth control today to aid in androgen effects of PCOS as well as irregular menstrual cycles. Endorses 1-2 menstrual cycles in the last year. Detailed discussion regarding importance of regulating menstrual cycles today in office.    Anxiety and depression symptoms greatly improved with 10 mg Lexapro. She is taking her medication daily and reports she is happy with her dose and her mood; she is smiling more. Continues with weekly therapy.     Continues with ketogenic diet but is introducing more fruits. Started going to the gym this week in hopes of building muscle. Four pound weight loss since last visit 6/5. Will continue to monitor.     Adjustment disorder with mixed anxiety and depressed mood -continue Lexapro 10 mg daily in morning; continue with therapy   2. PCOS (polycystic ovarian syndrome) -initiate Spironolactone 25 mg daily today -initiate Sprintec daily today -repeat testosterone labs in 3 months    3. Irregular periods -initiate Sprintec daily today   4. Hirsutism -initiate Spironolactone 25 mg daily today   5. Acne vulgaris 6. Weight loss   7. Pregnancy examination or test, negative result - POCT urine pregnancy   Return in 4 weeks for continued f/u of mood and f/u hormonal therapies   Chief Complaint: Cramping   History of Present Illness:  -feels like period cramping off and on for a month and half  -for past 2 weeks wakes up feeling nauseous, took PG test last week but it was negative; lwonders if it is too early -a little bleeding about a month ago for a day and 1/2  -supplements on her list she goes back and forth on taking  -stopped taking birth control pills a while ago  -cannot recall last birth control - maybe 2 or 3 years ago  -no pain with intercourse -no dysuria  -ibuprofen helps but then pain comes back  -poops every 3 days which is normal for her  -no blood or mucous in pee or poop  -health dept for testing about a month ago - negative STI screening  -new relationship, only about a month, broke up with fiance (said he was a narcissist)   Allergies  Allergen Reactions   Justicia Adhatoda (Malabar Nut Tree) [Justicia Adhatoda]    Other     ALL NUTS EXCEPT FOR  PEANUTS   Outpatient Medications Prior to Visit  Medication Sig Dispense Refill   albuterol (VENTOLIN HFA) 108 (90 Base) MCG/ACT inhaler Inhale 1-2 puffs into the lungs every 4 (four) hours as needed for wheezing or shortness of breath. 1 each 0   Ascorbic Acid (VITAMIN C) 100 MG tablet Take 100 mg by mouth daily.     Boric Acid Vaginal 600 MG SUPP Place vaginally.     Cyanocobalamin (B-12 PO) Take by mouth.      escitalopram (LEXAPRO) 10 MG tablet Take 1 tablet (10 mg total) by mouth daily. 30 tablet 3   FENUGREEK PO Take by mouth.     Ferrous Gluconate (IRON 27 PO) Take by mouth.     HYDROcodone-acetaminophen (NORCO) 5-325 MG tablet Take 1 tablet by mouth every 4 (four) hours as needed for moderate pain. 6 tablet 0   Lactobacillus (ACIDOPHILUS PO) Take by mouth.     Misc Natural Products (CRANBERRY/PROBIOTIC PO) Take by mouth.     multivitamin-iron-minerals-folic acid (CENTRUM) chewable tablet Chew 1 tablet by mouth daily. 90 tablet 2   norgestimate-ethinyl estradiol (SPRINTEC 28) 0.25-35 MG-MCG tablet Take 1 tablet daily. Discard placebos and take active pills for continuous cycling 112 tablet 4   Omega-3 Fatty Acids (OMEGA-3 EPA FISH OIL PO) Take by mouth.     spironolactone (ALDACTONE) 25 MG tablet Take 1 tablet (25 mg total) by mouth daily. 30 tablet 0   triamcinolone cream (KENALOG) 0.1 % Apply 1 Application topically 2 (two) times daily. 45 g 0   Turmeric (QC TUMERIC COMPLEX PO) Take by mouth.     No facility-administered medications prior to visit.     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  Visual Observations/Objective:   General Appearance: Well nourished well developed, in no apparent distress.  Eyes: conjunctiva no swelling or erythema ENT/Mouth: No hoarseness, No cough for duration of visit.  Neck: Supple   Respiratory: Respiratory effort normal, normal rate, no retractions or distress.   Cardio: Appears well-perfused, noncyanotic Musculoskeletal: no obvious deformity Skin: visible skin without rashes, ecchymosis, erythema Neuro: Awake and oriented X 3,  Psych:  normal affect, Insight and Judgment appropriate.    Assessment/Plan: 1. Dysmenorrhea 2. Irregular periods 3. PCOS (polycystic ovarian syndrome) -well appearing, no evidence of acute abdomen on video visit today  -hx of PCOS with no current hormonal management presents today with a course of about 1.5 months of intermittent cramping with irregular to scant bleeding in the context of new sexual partner -took home UPT that was negative and had negative gc/c testing since new partner  -discussed possibility of testing too early; will return to clinic for blood test and urine repeat testing  -Ddx include: STI infection, PCOS unmanaged symptoms of dysmenorrhea and cycle irregularity, endometriosis, early pregnancy, or GI etiology. Does not seem to be urology component based on current symptoms and symptoms not correlated to food intake or bowel habits. -Discussed obtained lab work, consider pelvic ultrasound  pending results; if negative beta Hcg, complete progesterone challenge and if no bleeding 2/2 to progesterone challenge, proceed with pelvic imaging for additional work-up.  -Naproxen as needed twice daily with food for cramping.  -Strict return precautions were discussed for new or worsening symptoms.   I discussed the assessment and treatment plan with the patient and/or parent/guardian.  They were provided an opportunity to ask questions and all were answered.  They agreed with the plan and demonstrated an understanding of the instructions. They were advised to call back or seek an in-person evaluation in the emergency room if the symptoms worsen or if the condition fails to improve as anticipated.   Follow-up:   obtain blood work  and urine screenings, next follow up pending results     Georges Mouse, NP    CC: Patient, No Pcp Per, No ref. provider found

## 2023-02-10 ENCOUNTER — Other Ambulatory Visit: Payer: Self-pay

## 2023-02-11 ENCOUNTER — Encounter (HOSPITAL_BASED_OUTPATIENT_CLINIC_OR_DEPARTMENT_OTHER): Payer: Self-pay | Admitting: Emergency Medicine

## 2023-02-11 ENCOUNTER — Emergency Department (HOSPITAL_BASED_OUTPATIENT_CLINIC_OR_DEPARTMENT_OTHER)
Admission: EM | Admit: 2023-02-11 | Discharge: 2023-02-11 | Disposition: A | Discharge status: Home / Self Care | Payer: Self-pay | Attending: Emergency Medicine | Admitting: Emergency Medicine

## 2023-02-11 ENCOUNTER — Other Ambulatory Visit: Payer: Self-pay

## 2023-02-11 DIAGNOSIS — J069 Acute upper respiratory infection, unspecified: Secondary | ICD-10-CM

## 2023-02-11 DIAGNOSIS — Z1152 Encounter for screening for COVID-19: Secondary | ICD-10-CM | POA: Insufficient documentation

## 2023-02-11 DIAGNOSIS — J020 Streptococcal pharyngitis: Secondary | ICD-10-CM | POA: Insufficient documentation

## 2023-02-11 LAB — LIPID PANEL
Cholesterol: 173 mg/dL (ref <200)
HDL: 53 mg/dL (ref >=50)
LDL Cholesterol (Calc): 88 mg/dL (calc)
Non-HDL Cholesterol (Calc): 120 mg/dL (calc) (ref <130)
Total CHOL/HDL Ratio: 3.3 (calc) (ref <5.0)
Triglycerides: 227 mg/dL — ABNORMAL HIGH (ref <150)

## 2023-02-11 LAB — CBC WITH DIFFERENTIAL/PLATELET
Absolute Monocytes: 682 cells/uL (ref 200–950)
Basophils Absolute: 57 cells/uL (ref 0–200)
Basophils Relative: 0.8 %
Eosinophils Absolute: 92 cells/uL (ref 15–500)
Eosinophils Relative: 1.3 %
HCT: 40 % (ref 35.0–45.0)
Hemoglobin: 13.4 g/dL (ref 11.7–15.5)
Lymphs Abs: 1931 cells/uL (ref 850–3900)
MCH: 30 pg (ref 27.0–33.0)
MCHC: 33.5 g/dL (ref 32.0–36.0)
MCV: 89.7 fL (ref 80.0–100.0)
MPV: 11.8 fL (ref 7.5–12.5)
Monocytes Relative: 9.6 %
Neutro Abs: 4338 cells/uL (ref 1500–7800)
Neutrophils Relative %: 61.1 %
Platelets: 192 10*3/uL (ref 140–400)
RBC: 4.46 10*6/uL (ref 3.80–5.10)
RDW: 11.9 % (ref 11.0–15.0)
Total Lymphocyte: 27.2 %
WBC: 7.1 10*3/uL (ref 3.8–10.8)

## 2023-02-11 LAB — COMPREHENSIVE METABOLIC PANEL
AG Ratio: 1.7 (calc) (ref 1.0–2.5)
ALT: 13 U/L (ref 6–29)
AST: 14 U/L (ref 10–30)
Albumin: 4.2 g/dL (ref 3.6–5.1)
Alkaline phosphatase (APISO): 83 U/L (ref 31–125)
BUN: 9 mg/dL (ref 7–25)
CO2: 28 mmol/L (ref 20–32)
Calcium: 9.3 mg/dL (ref 8.6–10.2)
Chloride: 104 mmol/L (ref 98–110)
Creat: 0.74 mg/dL (ref 0.50–0.96)
Globulin: 2.5 g/dL (calc) (ref 1.9–3.7)
Glucose, Bld: 75 mg/dL (ref 65–99)
Potassium: 4.4 mmol/L (ref 3.5–5.3)
Sodium: 140 mmol/L (ref 135–146)
Total Bilirubin: 0.2 mg/dL (ref 0.2–1.2)
Total Protein: 6.7 g/dL (ref 6.1–8.1)

## 2023-02-11 LAB — URINALYSIS, MICROSCOPIC (REFLEX)

## 2023-02-11 LAB — SARS CORONAVIRUS 2 BY RT PCR: SARS Coronavirus 2 by RT PCR: NEGATIVE

## 2023-02-11 LAB — URINALYSIS, ROUTINE W REFLEX MICROSCOPIC
Bilirubin Urine: NEGATIVE
Glucose, UA: NEGATIVE mg/dL
Ketones, ur: NEGATIVE mg/dL
Leukocytes,Ua: NEGATIVE
Nitrite: NEGATIVE
Protein, ur: NEGATIVE mg/dL
Specific Gravity, Urine: 1.025 (ref 1.005–1.030)
pH: 6.5 (ref 5.0–8.0)

## 2023-02-11 LAB — GROUP A STREP BY PCR: Group A Strep by PCR: DETECTED — AB

## 2023-02-11 LAB — C. TRACHOMATIS/N. GONORRHOEAE RNA
C. trachomatis RNA, TMA: NOT DETECTED
N. gonorrhoeae RNA, TMA: NOT DETECTED

## 2023-02-11 LAB — PREGNANCY, URINE: Preg Test, Ur: NEGATIVE

## 2023-02-11 LAB — HEMOGLOBIN A1C
Hgb A1c MFr Bld: 5.2 % of total Hgb (ref <5.7)
Mean Plasma Glucose: 103 mg/dL
eAG (mmol/L): 5.7 mmol/L

## 2023-02-11 LAB — VITAMIN D 25 HYDROXY (VIT D DEFICIENCY, FRACTURES): Vit D, 25-Hydroxy: 21 ng/mL — ABNORMAL LOW (ref 30–100)

## 2023-02-11 LAB — HCG, TOTAL, QUANTITATIVE: hCG, Beta Chain, Quant, S: <5 m[IU]/mL

## 2023-02-11 MED ORDER — DIPHENHYDRAMINE HCL 12.5 MG/5ML PO ELIX
25.0000 mg | ORAL_SOLUTION | Freq: Once | ORAL | Status: AC
  Administered 2023-02-11: 25 mg via ORAL
  Filled 2023-02-11: qty 10

## 2023-02-11 MED ORDER — PENICILLIN G BENZATHINE 1200000 UNIT/2ML IM SUSY
1.2000 10*6.[IU] | PREFILLED_SYRINGE | Freq: Once | INTRAMUSCULAR | Status: AC
  Administered 2023-02-11: 1.2 10*6.[IU] via INTRAMUSCULAR
  Filled 2023-02-11: qty 2

## 2023-02-11 MED ORDER — KETOROLAC TROMETHAMINE 60 MG/2ML IM SOLN
30.0000 mg | Freq: Once | INTRAMUSCULAR | Status: AC
  Administered 2023-02-11: 30 mg via INTRAMUSCULAR
  Filled 2023-02-11: qty 2

## 2023-02-11 MED ORDER — METOCLOPRAMIDE HCL 10 MG PO TABS
10.0000 mg | ORAL_TABLET | Freq: Once | ORAL | Status: AC
  Administered 2023-02-11: 10 mg via ORAL
  Filled 2023-02-11: qty 1

## 2023-02-11 NOTE — ED Triage Notes (Signed)
Pt states chest pain, headache, back pain, cough, fatigue and abdominal pain X 2 days. Has tried OTC meds with no relief.

## 2023-02-11 NOTE — ED Provider Notes (Signed)
[facility] Provider Note   CSN: [id number] Arrival date & time: 02/11/23  0105     History  Chief Complaint  Patient presents with   URI    Sue Scott is a 22 y.o. female.  The history is provided by the patient.  URI Presenting symptoms: congestion, cough, fatigue and sore throat   Presenting symptoms: no fever   Presenting symptoms comment:  Frontal headache  Severity:  Moderate Onset quality:  Gradual Timing:  Constant Progression:  Unchanged Chronicity:  New Relieved by:  Nothing Worsened by:  Nothing Ineffective treatments:  None tried Associated symptoms: myalgias   Associated symptoms: no neck pain and no wheezing   Risk factors: not elderly, no chronic cardiac disease and no chronic kidney disease   Patient also has nausea and diffuse abdominal cramping and pain with coughing.  No diarrhea.      Home Medications Prior to Admission medications   Medication Sig Start Date End Date Taking? Authorizing Provider  albuterol (VENTOLIN HFA) 108 (90 Base) MCG/ACT inhaler Inhale 1-2 puffs into the lungs every 4 (four) hours as needed for wheezing or shortness of breath. 01/27/22   Zenia Resides, MD  Ascorbic Acid (VITAMIN C) 100 MG tablet Take 100 mg by mouth daily.    [provider]  Boric Acid Vaginal 600 MG SUPP Place vaginally.    [provider]  Cyanocobalamin (B-12 PO) Take by mouth.    [provider]  FENUGREEK PO Take by mouth.    [provider]  Ferrous Gluconate (IRON 27 PO) Take by mouth.    [provider]  Misc Natural Products (CRANBERRY/PROBIOTIC PO) Take by mouth.    [provider]  multivitamin-iron-minerals-folic acid (CENTRUM) chewable tablet Chew 1 tablet by mouth daily. 08/21/21   Georges Mouse, NP  naproxen (NAPROSYN) 500 MG tablet Take 1 tablet (500 mg total) by mouth 2 (two) times daily with a meal. 01/22/23   Georges Mouse, NP   Omega-3 Fatty Acids (OMEGA-3 EPA FISH OIL PO) Take by mouth.    [provider]  Turmeric (QC TUMERIC COMPLEX PO) Take by mouth.    [provider]      Allergies    Bevelyn Buckles (malabar nut tree) [justicia adhatoda] and Other    Review of Systems   Review of Systems  Constitutional:  Positive for fatigue. Negative for fever.  HENT:  Positive for congestion and sore throat. Negative for tinnitus, trouble swallowing and voice change.   Eyes:  Negative for photophobia, redness and visual disturbance.  Respiratory:  Positive for cough. Negative for wheezing.   Gastrointestinal:  Positive for nausea. Negative for diarrhea and vomiting.  Genitourinary:  Negative for dysuria and flank pain.  Musculoskeletal:  Positive for myalgias. Negative for neck pain and neck stiffness.  All other systems reviewed and are negative.   Physical Exam Updated Vital Signs BP 122/60   Pulse 91   Temp 98.2 F (36.8 C)   Resp 20   Ht 5\' 3"  (1.6 m)   Wt 81.6 kg   SpO2 99%   BMI 31.89 kg/m  Physical Exam Vitals and nursing note reviewed.  Constitutional:      General: She is not in acute distress.    Appearance: Normal appearance. She is well-developed.  HENT:     Head: Normocephalic and atraumatic.     Nose: Congestion present.     Mouth/Throat:     Mouth:  Mucous membranes are moist.     Pharynx: Oropharynx is clear. No oropharyngeal exudate.  Eyes:     Pupils: Pupils are equal, round, and reactive to light.  Cardiovascular:     Rate and Rhythm: Normal rate and regular rhythm.     Pulses: Normal pulses.     Heart sounds: Normal heart sounds.  Pulmonary:     Effort: Pulmonary effort is normal. No respiratory distress.     Breath sounds: Normal breath sounds.  Abdominal:     General: Bowel sounds are normal. There is no distension.     Palpations: Abdomen is soft. There is no mass.     Tenderness: There is no abdominal tenderness. There is no guarding or rebound.  Negative signs include Murphy's sign, Rovsing's sign and McBurney's sign.     Hernia: No hernia is present.  Genitourinary:    Vagina: No vaginal discharge.  Musculoskeletal:        General: Normal range of motion.     Cervical back: Normal range of motion and neck supple. No rigidity.  Lymphadenopathy:     Cervical: No cervical adenopathy.  Skin:    General: Skin is warm and dry.     Capillary Refill: Capillary refill takes less than 2 seconds.     Findings: No erythema or rash.  Neurological:     General: No focal deficit present.     Mental Status: She is alert and oriented to person, place, and time.     Deep Tendon Reflexes: Reflexes normal.  Psychiatric:        Mood and Affect: Mood normal.     ED Results / Procedures / Treatments   Labs (all labs ordered are listed, but only abnormal results are displayed) Results for orders placed or performed during the hospital encounter of 02/11/23  SARS Coronavirus 2 by RT PCR (hospital order, performed in [facility] hospital lab) *cepheid single result test* Anterior Nasal Swab   Specimen: Anterior Nasal Swab  Result Value Ref Range   SARS Coronavirus 2 by RT PCR NEGATIVE NEGATIVE  Group A Strep by PCR   Specimen: Throat; Sterile Swab  Result Value Ref Range   Group A Strep by PCR DETECTED (A) NOT DETECTED  Urinalysis, Routine w reflex microscopic -Urine, Clean Catch  Result Value Ref Range   Color, Urine YELLOW YELLOW   APPearance CLEAR CLEAR   Specific Gravity, Urine 1.025 1.005 - 1.030   pH 6.5 5.0 - 8.0   Glucose, UA NEGATIVE NEGATIVE mg/dL   Hgb urine dipstick TRACE (A) NEGATIVE   Bilirubin Urine NEGATIVE NEGATIVE   Ketones, ur NEGATIVE NEGATIVE mg/dL   Protein, ur NEGATIVE NEGATIVE mg/dL   Nitrite NEGATIVE NEGATIVE   Leukocytes,Ua NEGATIVE NEGATIVE  Pregnancy, urine  Result Value Ref Range   Preg Test, Ur NEGATIVE NEGATIVE  Urinalysis, Microscopic (reflex)  Result Value Ref Range   RBC / HPF 0-5 0 - 5 RBC/hpf    WBC, UA 0-5 0 - 5 WBC/hpf   Bacteria, UA FEW (A) NONE SEEN   Squamous Epithelial / HPF 6-10 0 - 5 /HPF   No results found.  EKG None  Radiology No results found.  Procedures Procedures    Medications Ordered in ED Medications  penicillin g benzathine (BICILLIN LA) 1200000 UNIT/2ML injection 1.2 Million Units (has no administration in time range)  ketorolac (TORADOL) injection 30 mg (30 mg Intramuscular Given 02/11/23 0316)  metoCLOPramide (REGLAN) tablet 10 mg (10 mg Oral Given 02/11/23 0315)  diphenhydrAMINE (BENADRYL) 12.5 MG/5ML elixir 25 mg (25 mg Oral Given 02/11/23 1610)    ED Course/ Medical Decision Making/ A&P                             Medical Decision Making Patient with symptoms of URI including sore thoat   Amount and/or Complexity of Data Reviewed Independent Historian: parent    Details: See above  External Data Reviewed: notes.    Details: Previous notes reviewed  Labs: ordered.    Details: Negative covid. negative pregnancy test.  urine is without UTI. Strep is positive   Risk Prescription drug management. Risk Details: Cough and congestion is more consistent with URI.  I have treated for strep throat. Tylenol and motrin for pain and or fever.  Follow up with your PMD.  Stable for discharge.  Strict return.      Final Clinical Impression(s) / ED Diagnoses Final diagnoses:  Upper respiratory tract infection, unspecified type  Strep pharyngitis   Return for intractable cough, coughing up blood, fevers > 100.4 unrelieved by medication, shortness of breath, intractable vomiting, chest pain, shortness of breath, weakness, numbness, changes in speech, facial asymmetry, abdominal pain, passing out, Inability to tolerate liquids or food, cough, altered mental status or any concerns. No signs of systemic illness or infection. The patient is nontoxic-appearing on exam and vital signs are within normal limits.  I have reviewed the triage vital signs and the  nursing notes. Pertinent labs & imaging results that were available during my care of the patient were reviewed by me and considered in my medical decision making (see chart for details). After history, exam, and medical workup I feel the patient has been appropriately medically screened and is safe for discharge home. Pertinent diagnoses were discussed with the patient. Patient was given return precautions.  Rx / DC Orders ED Discharge Orders     None         Estelle Skibicki, MD 02/11/23 [phone]

## 2023-02-19 ENCOUNTER — Encounter: Payer: Self-pay | Admitting: Family

## 2023-02-19 ENCOUNTER — Telehealth (INDEPENDENT_AMBULATORY_CARE_PROVIDER_SITE_OTHER): Payer: Self-pay | Admitting: Family

## 2023-02-19 DIAGNOSIS — F410 Panic disorder [episodic paroxysmal anxiety] without agoraphobia: Secondary | ICD-10-CM

## 2023-02-19 DIAGNOSIS — L680 Hirsutism: Secondary | ICD-10-CM

## 2023-02-19 DIAGNOSIS — N926 Irregular menstruation, unspecified: Secondary | ICD-10-CM

## 2023-02-19 DIAGNOSIS — L700 Acne vulgaris: Secondary | ICD-10-CM

## 2023-02-19 DIAGNOSIS — E282 Polycystic ovarian syndrome: Secondary | ICD-10-CM

## 2023-02-19 DIAGNOSIS — F4323 Adjustment disorder with mixed anxiety and depressed mood: Secondary | ICD-10-CM

## 2023-02-19 MED ORDER — NORGESTIMATE-ETH ESTRADIOL 0.25-35 MG-MCG PO TABS: ORAL_TABLET | ORAL | 4 refills | Status: AC

## 2023-02-19 NOTE — Progress Notes (Signed)
THIS RECORD MAY CONTAIN CONFIDENTIAL INFORMATION THAT SHOULD NOT BE RELEASED WITHOUT REVIEW OF THE SERVICE PROVIDER.  Virtual Follow-Up Visit via Video Note  I connected with Sue Scott on 02/19/23 at  3:30 PM EDT by a video enabled telemedicine application and verified that I am speaking with the correct person using two identifiers.   Patient/parent location: home/car Provider location: remote [place]   I discussed the limitations of evaluation and management by telemedicine and the availability of in person appointments.  I discussed that the purpose of this telehealth visit is to provide medical care while limiting exposure to the novel coronavirus.  The patient expressed understanding and agreed to proceed.   Sue Scott is a 22 y.o. female referred by No ref. provider found here today for follow-up of mood, birth control.   History was provided by the patient.  Supervising Physician: Dr. Theadore Nan   Plan from Last Visit:   1. Dysmenorrhea 2. Irregular periods 3. PCOS (polycystic ovarian syndrome) -well appearing, no evidence of acute abdomen on video visit today  -hx of PCOS with no current hormonal management presents today with a course of about 1.5 months of intermittent cramping with irregular to scant bleeding in the context of new sexual partner -took home UPT that was negative and had negative gc/c testing since new partner  -discussed possibility of testing too early; will return to clinic for blood test and urine repeat testing  -Ddx include: STI infection, PCOS unmanaged symptoms of dysmenorrhea and cycle irregularity, endometriosis, early pregnancy, or GI etiology. Does not seem to be urology component based on current symptoms and symptoms not correlated to food intake or bowel habits. -Discussed obtained lab work, consider pelvic ultrasound pending results; if negative beta Hcg, complete progesterone challenge and if no bleeding 2/2 to progesterone  challenge, proceed with pelvic imaging for additional work-up.  -Naproxen as needed twice daily with food for cramping.  -Strict return precautions were discussed for new or worsening symptoms.    Pertinent Labs:    Chief Complaint: Beta-hCG negative 02/10/23 Vitamin D 21 Triglycerides 227, total cholesterol 173, HDL 53, LDL 88 GC/C negative  CBC and CMP WNL   History of Present Illness:  -sexually active, not using protection,but does not want protection  -LMP bled one day, for half day, about 2 weeks ago       02/19/2023    3:34 PM 03/04/2022   11:54 AM 12/29/2019   11:59 AM  PHQ-SADS Last 3 Score only  PHQ-15 Score 18 6 8   Total GAD-7 Score 21 6 11   PHQ Adolescent Score 19  12     Allergies  Allergen Reactions   Justicia Adhatoda (Malabar Nut Tree) [Justicia Adhatoda]    Other     ALL NUTS EXCEPT FOR PEANUTS   Outpatient Medications Prior to Visit  Medication Sig Dispense Refill   albuterol (VENTOLIN HFA) 108 (90 Base) MCG/ACT inhaler Inhale 1-2 puffs into the lungs every 4 (four) hours as needed for wheezing or shortness of breath. 1 each 0   Ascorbic Acid (VITAMIN C) 100 MG tablet Take 100 mg by mouth daily.     Boric Acid Vaginal 600 MG SUPP Place vaginally.     Cyanocobalamin (B-12 PO) Take by mouth.     FENUGREEK PO Take by mouth.     Ferrous Gluconate (IRON 27 PO) Take by mouth.     Misc Natural Products (CRANBERRY/PROBIOTIC PO) Take by mouth.     multivitamin-iron-minerals-folic acid (CENTRUM) chewable tablet Chew  1 tablet by mouth daily. 90 tablet 2   naproxen (NAPROSYN) 500 MG tablet Take 1 tablet (500 mg total) by mouth 2 (two) times daily with a meal. 30 tablet 0   Omega-3 Fatty Acids (OMEGA-3 EPA FISH OIL PO) Take by mouth.     Turmeric (QC TUMERIC COMPLEX PO) Take by mouth.     No facility-administered medications prior to visit.     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  Visual Observations/Objective:   General Appearance: Well nourished well developed, in no apparent distress.  Eyes: conjunctiva no swelling or erythema ENT/Mouth: No hoarseness, No cough for duration of visit.  Neck: Supple  Respiratory: Respiratory effort normal, normal rate, no retractions or distress.   Cardio: Appears well-perfused, noncyanotic Musculoskeletal: no obvious deformity Skin: visible skin without rashes, ecchymosis, erythema Neuro: Awake and oriented X 3,  Psych:  normal affect, Insight and Judgment appropriate.    Assessment/Plan:  1. Adjustment disorder with mixed anxiety and depressed mood 2. Panic attacks  -confirmed safety -Discussed the 4 known causes of improved serotonin function and mood improvement:  1) medications (SSRIs) 2) endorphins from exercise 3) natural sunlight (vitamin D) and 4) positive thoughts (therapy, religion/spirituality/support systems)  Neurotransmitters are essential for good brain function, both intellectually & emotionally.  Discussed the role of neurotransmitters in anxiety and depression symptoms and how medications work to prevent symptoms. Discussed therapy, pharmacological and both interventions.  At this time will start with just therapy.    - Amb ref to [facility]  3. PCOS (polycystic ovarian syndrome) 4. Irregular periods 5. Hirsutism 6. Acne vulgaris  -discussed options including pill, depo,  implant, IUD; she is open to restart birth control pills at this time.  - norgestimate-ethinyl estradiol (SPRINTEC 28) 0.25-35 MG-MCG tablet; Take 1 tablet daily. Discard placebos and take active pills for continuous cycling  Dispense: 112 tablet; Refill: 4   I discussed the assessment and treatment plan with the patient and/or parent/guardian.  They were provided an opportunity to ask questions and all were answered.  They agreed with the plan and demonstrated an understanding of the instructions. They were advised to call back or seek an in-person evaluation in the emergency room if the symptoms worsen or if the condition fails to improve as anticipated.   Follow-up:   BH ASAP, with me in two weeks   Georges Mouse, NP    CC: Patient, No Pcp Per, No ref. provider found

## 2023-02-20 ENCOUNTER — Encounter: Payer: Self-pay | Admitting: Family

## 2023-02-27 ENCOUNTER — Ambulatory Visit (INDEPENDENT_AMBULATORY_CARE_PROVIDER_SITE_OTHER): Payer: Self-pay | Admitting: Licensed Clinical Social Worker

## 2023-02-27 DIAGNOSIS — F4323 Adjustment disorder with mixed anxiety and depressed mood: Secondary | ICD-10-CM

## 2023-02-27 NOTE — BH Specialist Note (Signed)
Integrated Behavioral Health Initial In-Person Visit  MRN: [id number] Name: Sue Scott  Number of Integrated Behavioral Health Clinician visits: 1- Initial Visit  Session Start time: 1045    Session End time: 1135  Total time in minutes: 50   Types of Service: Individual psychotherapy  Interpretor:No. Interpretor Name and Language: n/a  Subjective: Sue Scott is a 22 y.o. female accompanied by  self Patient was referred by Beatriz Stallion NP for panic attacks and mood. Patient reports the following symptoms/concerns: long history of anxiety and depression, concerns with past romantic relationship, recently ended relationship, low motivation, tired regardless of amount of sleep, self-image concerns, history of disordered eating habits, "people-pleaser", difficulty with setting boundaries, recent death of grandfather, recent falling out with cousin (best friend), previously connected with therapist at [facility] (currently on waitlist)  Duration of problem: years, worsening recently; Severity of problem: moderate  Objective: Mood: Anxious and Affect: Appropriate Risk of harm to self or others: No plan to harm self or others  Life Context: Family and Social: Lives with parents  School/Work: Works in housekeeping, Public librarian: eats breakfast, showers every morning, tries to take time for skin care, takes vitamins, would like to go to gym more  Life Changes: Recently ended relationship, grandfather passed away (patient was very close to grandmother who passed when she was younger), falling out with cousin  Patient and/or Family's Strengths/Protective Factors: Social and Emotional competence, Good Insight into concerns, Clear Goals, Resilience, Close Relationship with mother, Employment, Motivated to Make Changes, History of therapy, On waitlist for ongoing therapy services  Goals Addressed: Patient will: Reduce symptoms of: anxiety and depression Increase  knowledge/use of: positive coping skills, healthy boundaries, and routine Process emotions related to relationship changes and increase positive social interactions/supports  Progress towards Goals: Ongoing  Interventions: Interventions utilized: Solution-Focused Strategies, Psychoeducation and/or Health Education, and Supportive Reflection  Standardized Assessments completed: Not Needed  Patient and/or Family Response: Patient worked to process recent events and stressors. Patient reported difficulty with self-image and made connection to difficulty with relationships. Patient identified concerns with maintaining healthy boundaries and expressed interest in increasing healthy social interactions. Patient worked to identify overall goals and break them down into smaller steps. Patient was able to acknowledge positive steps she is already taking towards long term goals. Patient identified creating more routine and increasing motivation (to support routine) as first steps. Patient collaborated with [facility] to identify plan below.   Patient Centered Plan: Patient is on the following Treatment Plan(s):  Anxiety and Depression   Assessment: Patient currently experiencing significant stressors and adjustments, ongoing anxiety and depression symptoms, and grief.   Patient may benefit from continued support of this clinic to process emotions, support positive coping/boundary setting, and bridge connection to ongoing outpatient counseling.   Plan: Follow up with behavioral health clinician on : 6/21 at 10:30 AM Behavioral recommendations: Take things one step at a time and start small (like getting on more consistent sleep schedule). If you're having trouble getting started with a task- try talking yourself into 5 minutes (or a song or two- whatever you can convince yourself to do). If you still don't want to do the thing after 5 minutes- stop.  Referral(s): Integrated [facility] (In  Clinic). Currently on wait list with Kellin  "From scale of 1-10, how likely are you to follow plan?": Patient agreeable to above plan   Isabelle Course, [facility]

## 2023-03-05 ENCOUNTER — Encounter: Payer: Self-pay | Admitting: Family

## 2023-03-05 NOTE — BH Specialist Note (Deleted)
Integrated Behavioral Health Follow Up In-Person Visit  MRN: [id number] Name: Sue Scott  Number of Integrated Behavioral Health Clinician visits: 1- Initial Visit  Session Start time: 1045   Session End time: 1135  Total time in minutes: 50   Types of Service: {CHL AMB TYPE OF SERVICE:[phone]}  Interpretor:{yes QM:[id number]} Interpretor Name and Language: ***  Subjective: Sue Scott is a 22 y.o. female accompanied by {Patient accompanied by:[id number]} Patient was referred by *** for ***. Patient reports the following symptoms/concerns: *** Duration of problem: ***; Severity of problem: {Mild/Moderate/Severe:20260}  Objective: Mood: {BHH MOOD:22306} and Affect: {BHH AFFECT:22307} Risk of harm to self or others: {CHL AMB BH Suicide Current Mental Status:21022748}  Life Context: Family and Social: *** School/Work: *** Self-Care: *** Life Changes: ***  Patient and/or Family's Strengths/Protective Factors: {CHL AMB BH PROTECTIVE FACTORS:9380842138}  Goals Addressed: Patient will:  Reduce symptoms of: {IBH Symptoms:21014056}   Increase knowledge and/or ability of: {IBH Patient Tools:21014057}   Demonstrate ability to: {IBH Goals:21014053}  Progress towards Goals: {CHL AMB BH PROGRESS TOWARDS GOALS:[phone]}  Interventions: Interventions utilized:  {IBH Interventions:21014054} Standardized Assessments completed: {IBH Screening Tools:21014051}  Patient and/or Family Response: ***  Patient Centered Plan: Patient is on the following Treatment Plan(s): *** Assessment: Patient currently experiencing ***.   Patient may benefit from ***.  Plan: Follow up with behavioral health clinician on : *** Behavioral recommendations: *** Referral(s): {IBH Referrals:21014055} "From scale of 1-10, how likely are you to follow plan?": ***  Isabelle Course, [facility]

## 2023-03-06 ENCOUNTER — Ambulatory Visit: Payer: Self-pay | Admitting: Licensed Clinical Social Worker

## 2023-03-12 ENCOUNTER — Telehealth: Payer: Self-pay | Admitting: Family

## 2023-03-12 ENCOUNTER — Encounter: Payer: Self-pay | Admitting: Family

## 2023-03-12 DIAGNOSIS — Z91199 Patient's noncompliance with other medical treatment and regimen due to unspecified reason: Secondary | ICD-10-CM

## 2023-03-12 NOTE — Progress Notes (Signed)
Link sent, no answer. Patient not seen. Closed for admin purposes.  

## 2023-05-15 ENCOUNTER — Encounter: Payer: Self-pay | Admitting: Family

## 2023-05-15 ENCOUNTER — Telehealth: Payer: Self-pay

## 2023-05-15 NOTE — Telephone Encounter (Signed)
Called patient regarding missed/no show appointments on 05/15/23, 03/12/23, and 03/05/23 no answer left message to call back regarding appointments and scheduling.

## 2023-05-28 NOTE — Telephone Encounter (Signed)
Called patient to reschedule missed appointment no answer, LMTCB to schedule.

## 2023-06-01 NOTE — Telephone Encounter (Signed)
Spoke with patient scheduled follow up appointment patient agrees to keep appointment and call if she needs to reschedule.

## 2023-06-04 ENCOUNTER — Ambulatory Visit (INDEPENDENT_AMBULATORY_CARE_PROVIDER_SITE_OTHER): Payer: Self-pay | Admitting: Family

## 2023-06-04 ENCOUNTER — Encounter: Payer: Self-pay | Admitting: Family

## 2023-06-04 ENCOUNTER — Other Ambulatory Visit (HOSPITAL_COMMUNITY)
Admission: RE | Admit: 2023-06-04 | Discharge: 2023-06-04 | Disposition: A | Discharge status: Home / Self Care | Payer: Self-pay | Source: Ambulatory Visit | Attending: Family | Admitting: Family

## 2023-06-04 VITALS — BP 109/69 | HR 78 | Ht 63.39 in | Wt 217.8 lb

## 2023-06-04 DIAGNOSIS — F4323 Adjustment disorder with mixed anxiety and depressed mood: Secondary | ICD-10-CM

## 2023-06-04 DIAGNOSIS — Z113 Encounter for screening for infections with a predominantly sexual mode of transmission: Secondary | ICD-10-CM

## 2023-06-04 DIAGNOSIS — E282 Polycystic ovarian syndrome: Secondary | ICD-10-CM

## 2023-06-04 DIAGNOSIS — N926 Irregular menstruation, unspecified: Secondary | ICD-10-CM

## 2023-06-04 DIAGNOSIS — Z3202 Encounter for pregnancy test, result negative: Secondary | ICD-10-CM

## 2023-06-04 LAB — POCT URINE PREGNANCY: Preg Test, Ur: NEGATIVE

## 2023-06-04 NOTE — Progress Notes (Signed)
History was provided by the patient.  Sue Scott is a 22 y.o. female who is here for PCOS and STI screening.   PCP confirmed? No  Plan from last visit 03/12/23 1. Adjustment disorder with mixed anxiety and depressed mood 2. Panic attacks   -confirmed safety -Discussed the 4 known causes of improved serotonin function and mood improvement:  1) medications (SSRIs) 2) endorphins from exercise 3) natural sunlight (vitamin D) and 4) positive thoughts (therapy, religion/spirituality/support systems)  Neurotransmitters are essential for good brain function, both intellectually & emotionally.  Discussed the role of neurotransmitters in anxiety and depression symptoms and how medications work to prevent symptoms. Discussed therapy, pharmacological and both interventions.  At this time will start with just therapy.      - Amb ref to Integrated Behavioral Health   3. PCOS (polycystic ovarian syndrome) 4. Irregular periods 5. Hirsutism 6. Acne vulgaris   -discussed options including pill, depo, implant, IUD; she is open to restart birth control pills at this time.  - norgestimate-ethinyl estradiol (SPRINTEC 28) 0.25-35 MG-MCG tablet; Take 1 tablet daily. 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  -has been going through a lot mentally so had to get in therapy; called her last week and will start this upcoming week  -not taking birth control pills, new partner; wants to be screened -no pain with intercourse -LMP 06/18-06/24  Patient Active Problem List   Diagnosis Date Noted   PCOS (polycystic ovarian syndrome) 03/07/2019   Nexplanon in place 08/19/2018   Labial hypertrophy 08/13/2017   Acne vulgaris 08/13/2017   Hirsutism 06/18/2017    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 1 each 0   Boric Acid Vaginal 600 MG SUPP Place vaginally.     Ascorbic Acid (VITAMIN C) 100 MG tablet Take 100 mg by mouth daily. (Patient not taking: Reported on 06/04/2023)     Cyanocobalamin (B-12 PO) Take by mouth. (Patient not taking: Reported on 06/04/2023)     FENUGREEK PO Take by mouth. (Patient not taking: Reported on 06/04/2023)     Ferrous Gluconate (IRON 27 PO) Take by mouth. (Patient not taking: Reported on 06/04/2023)     Misc Natural Products (CRANBERRY/PROBIOTIC PO) Take by mouth. (Patient not taking: Reported on 06/04/2023)     multivitamin-iron-minerals-folic acid (CENTRUM) chewable tablet Chew 1 tablet by mouth daily. (Patient not taking: Reported on 06/04/2023) 90 tablet 2   naproxen (NAPROSYN) 500 MG tablet Take 1 tablet (500 mg total) by mouth 2 (two) times daily with a meal. (Patient not taking: Reported on 06/04/2023) 30 tablet 0   norgestimate-ethinyl estradiol (SPRINTEC 28) 0.25-35 MG-MCG tablet Take 1 tablet daily. Discard placebos and take active pills for continuous cycling (Patient not taking: Reported on 06/04/2023) 112 tablet 4   Omega-3 Fatty Acids (OMEGA-3 EPA FISH OIL PO) Take by mouth. (Patient not taking: Reported on  06/04/2023)     Turmeric (QC TUMERIC COMPLEX PO) Take by mouth. (Patient not taking: Reported on 06/04/2023)     No current facility-administered medications on file prior to visit.    Allergies  Allergen Reactions   Justicia Adhatoda (Malabar Nut Tree) [Justicia Adhatoda]    Other     ALL NUTS EXCEPT FOR PEANUTS    Physical  Exam:    Vitals:   06/04/23 1327  BP: 109/69  Pulse: 78  Weight: 217 lb 12.8 oz (98.8 kg)  Height: 5' 3.39" (1.61 m)    Growth %ile SmartLinks can only be used for patients less than 89 years old. No LMP recorded. (Menstrual status: Irregular Periods).  Physical Exam Vitals and nursing note reviewed.  Constitutional:      General: She is not in acute distress.    Appearance: She is well-developed. She is obese.  HENT:     Head: Normocephalic.  Neck:     Thyroid: No thyromegaly.  Cardiovascular:     Rate and Rhythm: Normal rate and regular rhythm.     Heart sounds: No murmur heard. Pulmonary:     Breath sounds: Normal breath sounds.  Musculoskeletal:     Cervical back: Normal range of motion.     Right lower leg: No edema.     Left lower leg: No edema.  Lymphadenopathy:     Cervical: No cervical adenopathy.  Skin:    General: Skin is warm and dry.     Findings: No rash.  Neurological:     General: No focal deficit present.     Mental Status: She is alert and oriented to person, place, and time.     Motor: No tremor.     Comments: No tremor  Psychiatric:        Attention and Perception: Attention normal.        Mood and Affect: Mood is anxious.        Speech: Speech normal.        Behavior: Behavior normal.      Assessment/Plan: 1. PCOS (polycystic ovarian syndrome) 2. Irregular periods -restart birth control pills; discussed the risks of endometrial hyperplasia with prolonged missed cycles; negative pregnancy test; she is not currently insured so we discussed waiting on blood test for PCOS comorb labs today to avoid high costs (last A1c was 5.2 in May, total cholesterol 173, triglycerides 227, LFTs WNL, Hgb 13.4, vitamin D 21) -2 months video visit or sooner if new or worsening symptoms.   3. Adjustment disorder with mixed anxiety and depressed mood -start therapy with [facility]; return precautions reviewed   4. Routine screening for STI (sexually  transmitted infection) - Urine cytology ancillary only  5. Pregnancy examination or test, negative result - POCT urine pregnancy

## 2023-06-05 ENCOUNTER — Encounter: Payer: Self-pay | Admitting: Family

## 2023-06-05 LAB — URINE CYTOLOGY ANCILLARY ONLY
Bacterial Vaginitis-Urine: NEGATIVE
Candida Urine: POSITIVE — AB
Chlamydia: NEGATIVE
Comment: NEGATIVE
Comment: NEGATIVE
Comment: NORMAL
Neisseria Gonorrhea: NEGATIVE
Trichomonas: NEGATIVE

## 2023-07-11 ENCOUNTER — Other Ambulatory Visit: Payer: Self-pay | Admitting: Family

## 2023-07-11 ENCOUNTER — Encounter: Payer: Self-pay | Admitting: Family

## 2023-07-11 MED ORDER — NITROFURANTOIN MONOHYD MACRO 100 MG PO CAPS: 100.0000 mg | ORAL_CAPSULE | Freq: Two times a day (BID) | ORAL | 0 refills | Status: AC

## 2023-08-04 ENCOUNTER — Encounter: Payer: Self-pay | Admitting: Family

## 2023-08-04 ENCOUNTER — Telehealth: Payer: Self-pay | Admitting: Family

## 2023-08-10 ENCOUNTER — Telehealth: Payer: Self-pay | Admitting: Family

## 2023-08-14 ENCOUNTER — Encounter: Payer: Self-pay | Admitting: Family

## 2023-08-17 ENCOUNTER — Encounter: Payer: Self-pay | Admitting: Family

## 2023-08-17 ENCOUNTER — Telehealth: Payer: Self-pay | Admitting: Family

## 2023-08-17 DIAGNOSIS — E282 Polycystic ovarian syndrome: Secondary | ICD-10-CM

## 2023-08-17 NOTE — Progress Notes (Signed)
Connected with patient briefly on video. Does not need appointment today, not sure why she was rescheduled; symptoms have improved since medication to treat UTI; currently having monthly period, no symptoms. No concerns. Advised no charge for this call. Will close for administrative purposes.

## 2023-09-19 ENCOUNTER — Ambulatory Visit
Admission: RE | Admit: 2023-09-19 | Discharge: 2023-09-19 | Disposition: A | Discharge status: Home / Self Care | Payer: Self-pay | Source: Ambulatory Visit

## 2023-09-19 ENCOUNTER — Other Ambulatory Visit: Payer: Self-pay

## 2023-09-19 VITALS — BP 103/55 | HR 88 | Temp 97.9°F | Resp 18

## 2023-09-19 DIAGNOSIS — J45901 Unspecified asthma with (acute) exacerbation: Secondary | ICD-10-CM

## 2023-09-19 DIAGNOSIS — H65191 Other acute nonsuppurative otitis media, right ear: Secondary | ICD-10-CM

## 2023-09-19 MED ORDER — AMOXICILLIN-POT CLAVULANATE 875-125 MG PO TABS: 1.0000 | ORAL_TABLET | Freq: Two times a day (BID) | ORAL | 0 refills | Status: AC

## 2023-09-19 MED ORDER — PREDNISONE 20 MG PO TABS: 40.0000 mg | ORAL_TABLET | Freq: Every day | ORAL | 0 refills | Status: AC

## 2023-09-19 NOTE — ED Provider Notes (Signed)
 [facility]    CSN: [id number] Arrival date & time: 09/19/23  1245      History   Chief Complaint Chief Complaint  Patient presents with   Cough    Coughing, sneezing, runny nose, fever, chills - Entered by patient    HPI Sue Scott is a 23 y.o. female.   Patient here today for evaluation of cough and upper respiratory symptoms she has had for the last 6 days.  She does have known asthma and states she has had some shortness of breath and wheezing.  She notes she has had some right ear pain as well.  Mother is here with similar symptoms that started after hers.  The history is provided by the patient.  Cough Associated symptoms: ear pain, shortness of breath, sore throat and wheezing   Associated symptoms: no chills, no eye discharge and no fever     Past Medical History:  Diagnosis Date   Adjustment disorder with mixed anxiety and depressed mood    Asthma    GERD (gastroesophageal reflux disease)    PCOS (polycystic ovarian syndrome)     Patient Active Problem List   Diagnosis Date Noted   PCOS (polycystic ovarian syndrome) 03/07/2019   Nexplanon  in place 08/19/2018   Labial hypertrophy 08/13/2017   Acne vulgaris 08/13/2017   Hirsutism 06/18/2017   Severe obesity with body mass index (BMI) greater than 99th percentile for age in childhood (HCC) 06/18/2017   Adjustment disorder with mixed anxiety and depressed mood 06/18/2017    Past Surgical History:  Procedure Laterality Date   adnoidectomy     extraction of wisdom teeth      OB History   No obstetric history on file.      Home Medications    Prior to Admission medications   Medication Sig Start Date End Date Taking? Authorizing Provider  Albuterol  Sulfate 2.5 MG/0.5ML NEBU Inhale 2.5 mg into the lungs every 4 (four) hours as needed (Wheezing, Coughing, SOB). 11/27/11  Yes [provider]  amoxicillin -clavulanate (AUGMENTIN ) 875-125 MG tablet Take 1 tablet by mouth every 12  (twelve) hours. 09/19/23  Yes Billy Asberry FALCON, PA-C  Beclomethasone Dipropionate (QNASL) 80 MCG/ACT AERS Place into the nose as directed. 11/27/11  Yes [provider]  cetirizine  (ZYRTEC ) 10 MG tablet Take 10 mg by mouth daily. 11/27/11  Yes [provider]  montelukast (SINGULAIR) 5 MG chewable tablet Chew 5 mg by mouth at bedtime. 11/27/11  Yes [provider]  Omeprazole Magnesium (PRILOSEC PO) Take 20 mg by mouth daily. 11/27/11  Yes [provider]  predniSONE  (DELTASONE ) 20 MG tablet Take 2 tablets (40 mg total) by mouth daily with breakfast for 5 days. 09/19/23 09/24/23 Yes Billy Asberry FALCON, PA-C  albuterol  (VENTOLIN  HFA) 108 (90 Base) MCG/ACT inhaler Inhale 1-2 puffs into the lungs every 4 (four) hours as needed for wheezing or shortness of breath. 01/27/22   Banister, Pamela K, MD  Ascorbic Acid (VITAMIN C) 100 MG tablet Take 100 mg by mouth daily. Patient not taking: Reported on 06/04/2023    [provider]  Boric Acid Vaginal 600 MG SUPP Place vaginally.    [provider]  Cyanocobalamin (B-12 PO) Take by mouth. Patient not taking: Reported on 06/04/2023    [provider]  FENUGREEK PO Take by mouth. Patient not taking: Reported on 06/04/2023    [provider]  Ferrous Gluconate (IRON 27 PO) Take by mouth. Patient not taking: Reported on 06/04/2023  [provider]  Misc Natural Products (CRANBERRY/PROBIOTIC PO) Take by mouth. Patient not taking: Reported on 06/04/2023    [provider]  multivitamin-iron-minerals-folic acid (CENTRUM) chewable tablet Chew 1 tablet by mouth daily. Patient not taking: Reported on 06/04/2023 08/21/21   Joshua Bari HERO, NP  naproxen  (NAPROSYN ) 500 MG tablet Take 1 tablet (500 mg total) by mouth 2 (two) times daily with a meal. Patient not taking: Reported on 06/04/2023 01/22/23   Joshua Bari HERO, NP  norgestimate -ethinyl estradiol  (SPRINTEC 28) 0.25-35 MG-MCG tablet Take 1  tablet daily. Discard placebos and take active pills for continuous cycling Patient not taking: Reported on 06/04/2023 02/19/23   Joshua Bari HERO, NP  Omega-3 Fatty Acids (OMEGA-3 EPA FISH OIL PO) Take by mouth. Patient not taking: Reported on 06/04/2023    [provider]  Turmeric (QC TUMERIC COMPLEX PO) Take by mouth. Patient not taking: Reported on 06/04/2023    [provider]    Family History Family History  Problem Relation Age of Onset   Healthy Mother    Healthy Father    Asthma Maternal Grandmother    Anxiety disorder Maternal Grandmother    Depression Maternal Grandmother    Anxiety disorder Paternal Grandmother    Depression Paternal Grandmother    Lung disease Paternal Grandfather    Pulmonary fibrosis Paternal Grandfather    Asthma Maternal Aunt    Crohn's disease Maternal Aunt     Social History Social History   Tobacco Use   Smoking status: Never    Passive exposure: Yes   Smokeless tobacco: Never  Vaping Use   Vaping status: Former  Substance Use Topics   Alcohol use: Yes    Comment: rare   Drug use: Yes    Types: Marijuana     Allergies   Justicia adhatoda (malabar nut tree) [justicia adhatoda] and Other   Review of Systems Review of Systems  Constitutional:  Negative for chills and fever.  HENT:  Positive for congestion, ear pain, sinus pressure and sore throat.   Eyes:  Negative for discharge and redness.  Respiratory:  Positive for cough, shortness of breath and wheezing.   Gastrointestinal:  Negative for abdominal pain, diarrhea, nausea and vomiting.     Physical Exam Triage Vital Signs ED Triage Vitals [09/19/23 1340]  Encounter Vitals Group     BP (!) 103/55     Systolic BP Percentile      Diastolic BP Percentile      Pulse Rate 88     Resp 18     Temp 97.9 F (36.6 C)     Temp Source Oral     SpO2 96 %     Weight      Height      Head Circumference      Peak Flow      Pain Score 3     Pain Loc      Pain  Education      Exclude from Growth Chart    No data found.  Updated Vital Signs BP (!) 103/55 (BP Location: Right Arm)   Pulse 88   Temp 97.9 F (36.6 C) (Oral)   Resp 18   SpO2 96%   Visual Acuity Right Eye Distance:   Left Eye Distance:   Bilateral Distance:    Right Eye Near:   Left Eye Near:    Bilateral Near:     Physical Exam Vitals and nursing note reviewed.  Constitutional:  General: She is not in acute distress.    Appearance: Normal appearance. She is not ill-appearing.  HENT:     Head: Normocephalic and atraumatic.     Left Ear: Tympanic membrane normal.     Ears:     Comments: Right TM erythematous    Nose: Congestion present.     Mouth/Throat:     Mouth: Mucous membranes are moist.     Pharynx: No oropharyngeal exudate or posterior oropharyngeal erythema.  Eyes:     Conjunctiva/sclera: Conjunctivae normal.  Cardiovascular:     Rate and Rhythm: Normal rate and regular rhythm.     Heart sounds: Normal heart sounds. No murmur heard. Pulmonary:     Effort: Pulmonary effort is normal. No respiratory distress.     Breath sounds: Normal breath sounds. No wheezing, rhonchi or rales.  Skin:    General: Skin is warm and dry.  Neurological:     Mental Status: She is alert.  Psychiatric:        Mood and Affect: Mood normal.        Thought Content: Thought content normal.      UC Treatments / Results  Labs (all labs ordered are listed, but only abnormal results are displayed) Labs Reviewed - No data to display  EKG   Radiology No results found.  Procedures Procedures (including critical care time)  Medications Ordered in UC Medications - No data to display  Initial Impression / Assessment and Plan / UC Course  I have reviewed the triage vital signs and the nursing notes.  Pertinent labs & imaging results that were available during my care of the patient were reviewed by me and considered in my medical decision making (see chart for  details).    Will treat to cover asthma exacerbation with steroid burst and Augmentin  prescribed to cover otitis media as well as possible impending sinus infection.  Encouraged follow-up if no gradual improvement with treatment or with any further concerns.  Final Clinical Impressions(s) / UC Diagnoses   Final diagnoses:  Asthma with acute exacerbation, unspecified asthma severity, unspecified whether persistent  Other acute nonsuppurative otitis media of right ear, recurrence not specified   Discharge Instructions   None    ED Prescriptions     Medication Sig Dispense Auth. Provider   amoxicillin -clavulanate (AUGMENTIN ) 875-125 MG tablet Take 1 tablet by mouth every 12 (twelve) hours. 14 tablet Billy Stabs F, PA-C   predniSONE  (DELTASONE ) 20 MG tablet Take 2 tablets (40 mg total) by mouth daily with breakfast for 5 days. 10 tablet Billy Stabs FALCON, PA-C      PDMP not reviewed this encounter.   Billy Stabs FALCON, PA-C 09/19/23 1350

## 2023-09-19 NOTE — ED Triage Notes (Signed)
 Pt here for cough/uri x 6 days; pt sts ear pain also

## 2024-02-24 ENCOUNTER — Encounter: Payer: Self-pay | Admitting: Family

## 2024-02-24 ENCOUNTER — Telehealth (INDEPENDENT_AMBULATORY_CARE_PROVIDER_SITE_OTHER): Payer: Self-pay | Admitting: Family

## 2024-02-24 DIAGNOSIS — E282 Polycystic ovarian syndrome: Secondary | ICD-10-CM

## 2024-02-24 DIAGNOSIS — G479 Sleep disorder, unspecified: Secondary | ICD-10-CM

## 2024-02-24 DIAGNOSIS — F4323 Adjustment disorder with mixed anxiety and depressed mood: Secondary | ICD-10-CM

## 2024-02-24 DIAGNOSIS — F32A Depression, unspecified: Secondary | ICD-10-CM

## 2024-02-24 NOTE — Progress Notes (Signed)
 THIS RECORD MAY CONTAIN CONFIDENTIAL INFORMATION THAT SHOULD NOT BE RELEASED WITHOUT REVIEW OF THE SERVICE PROVIDER.  Virtual Follow-Up Visit via Video Note  I connected with Sue Scott and her mom  on 02/24/24 at 10:00 AM EDT by a video enabled telemedicine application and verified that I am speaking with the correct person using two identifiers.   Patient/parent location: home Provider location: remote, [place]    I discussed the limitations of evaluation and management by telemedicine and the availability of in person appointments.  I discussed that the purpose of this telehealth visit is to provide medical care while limiting exposure to the novel coronavirus.  The patient expressed understanding and agreed to proceed.   Sue Scott is a 23 y.o. female referred by No ref. provider found here today for follow-up of mood concerns.    History was provided by the patient and mother.  Supervising Physician: Dr. Lavonda Pour   Plan from Last Visit 06/04/23  1. PCOS (polycystic ovarian syndrome) 2. Irregular periods -restart birth control pills; discussed the risks of endometrial hyperplasia with prolonged missed cycles; negative pregnancy test; she is not currently insured so we discussed waiting on blood test for PCOS comorb labs today to avoid high costs (last A1c was 5.2 in May, total cholesterol 173, triglycerides 227, LFTs WNL, Hgb 13.4, vitamin D  21) -2 months video visit or sooner if new or worsening symptoms.    3. Adjustment disorder with mixed anxiety and depressed mood -start therapy with [facility]; return precautions reviewed    4. Routine screening for STI (sexually transmitted infection) - Urine cytology ancillary only   5. Pregnancy examination or test, negative result - POCT urine pregnancy    Chief Complaint: Feeling really down  History of Present Illness:  -about 2 weeks ago suicidal, mom found out Thursday of last week; she does not have a plan  but feels very down and depressed -has not been getting out of her room, sleeping a lot; not doing things she normally would do -per mom: gaining a lot of weight, worried about PCOS, unprotected sex  -LMP January; stopped taking birth control pills because she did not like the way she felt on them -last HPT was negative yesterday  -went to [facility] for 3 therapists; one clicked, one not so much, the other she felt judged by   Allergies  Allergen Reactions   Justicia Adhatoda ([place] Nut Tree) [Justicia Adhatoda]    Other     ALL NUTS EXCEPT FOR PEANUTS   Outpatient Medications Prior to Visit  Medication Sig Dispense Refill   albuterol  (VENTOLIN  HFA) 108 (90 Base) MCG/ACT inhaler Inhale 1-2 puffs into the lungs every 4 (four) hours as needed for wheezing or shortness of breath. 1 each 0   Albuterol  Sulfate 2.5 MG/0.5ML NEBU Inhale 2.5 mg into the lungs every 4 (four) hours as needed (Wheezing, Coughing, SOB).     amoxicillin -clavulanate (AUGMENTIN ) 875-125 MG tablet Take 1 tablet by mouth every 12 (twelve) hours. 14 tablet 0   Ascorbic Acid (VITAMIN C) 100 MG tablet Take 100 mg by mouth daily. (Patient not taking: Reported on 06/04/2023)     Beclomethasone Dipropionate (QNASL) 80 MCG/ACT AERS Place into the nose as directed.     Boric Acid Vaginal 600 MG SUPP Place vaginally.     cetirizine  (ZYRTEC ) 10 MG tablet Take 10 mg by mouth daily.     Cyanocobalamin (B-12 PO) Take by mouth. (Patient not taking: Reported on 06/04/2023)  FENUGREEK PO Take by mouth. (Patient not taking: Reported on 06/04/2023)     Ferrous Gluconate (IRON 27 PO) Take by mouth. (Patient not taking: Reported on 06/04/2023)     Misc Natural Products (CRANBERRY/PROBIOTIC PO) Take by mouth. (Patient not taking: Reported on 06/04/2023)     montelukast (SINGULAIR) 5 MG chewable tablet Chew 5 mg by mouth at bedtime.     multivitamin-iron-minerals-folic acid (CENTRUM) chewable tablet Chew 1 tablet by mouth daily.  (Patient not taking: Reported on 06/04/2023) 90 tablet 2   naproxen  (NAPROSYN ) 500 MG tablet Take 1 tablet (500 mg total) by mouth 2 (two) times daily with a meal. (Patient not taking: Reported on 06/04/2023) 30 tablet 0   norgestimate -ethinyl estradiol  (SPRINTEC 28) 0.25-35 MG-MCG tablet Take 1 tablet daily. Discard placebos and take active pills for continuous cycling (Patient not taking: Reported on 06/04/2023) 112 tablet 4   Omega-3 Fatty Acids (OMEGA-3 EPA FISH OIL PO) Take by mouth. (Patient not taking: Reported on 06/04/2023)     Omeprazole Magnesium (PRILOSEC PO) Take 20 mg by mouth daily.     Turmeric (QC TUMERIC COMPLEX PO) Take by mouth. (Patient not taking: Reported on 06/04/2023)     No facility-administered medications prior to visit.     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  Visual Observations/Objective:   General Appearance: Well nourished well developed, in no apparent distress.  Eyes: conjunctiva no swelling or erythema ENT/Mouth: No hoarseness, No cough for duration of visit.  Neck: Supple  Respiratory: Respiratory effort normal, normal rate, no retractions or distress.   Cardio: Appears well-perfused, noncyanotic Musculoskeletal: no obvious deformity Skin: visible skin without rashes, ecchymosis, erythema Neuro: Awake and oriented X 3,  Psych:  normal affect, Insight and Judgment appropriate.     Assessment/Plan: 1. Depressive episode (Primary) 2. Adjustment disorder with mixed anxiety and depressed mood.  3. Sleep disturbance 4. PCOS (polycystic ovarian syndrome)  -confirmed safety; confirmed [facility] and information given to patient for 24-hr care as needed for any safety concerns -also advised of therapy sessions (2nd floor) first-come/first-serve starting 8 AM tomorrow, alternatively can see her in clinic (she is concerned about billing) -explained PCOS symptom management and importance of avoiding endometrial hyperplasia and that she is due for PAP smear; mom advised that she will take her to the health dept where she gets tx  -explained that she will age out of our practice next month, however can assist as she gets referrals for ongoing care during this time  I discussed the assessment and treatment plan with the patient and/or parent/guardian.  They were provided an opportunity to ask questions and all were answered.  They agreed with the plan and demonstrated an understanding of the instructions. They were advised to call back or seek an in-person evaluation in the emergency room if the symptoms worsen or if the condition fails to improve as anticipated.   Follow-up:   as needed (see My Chart for  information provided to patient as above)   Marijean Shouts, NP    CC: Patient, No Pcp Per, No ref. provider found

## 2024-03-09 ENCOUNTER — Telehealth: Payer: Self-pay

## 2024-03-09 NOTE — Telephone Encounter (Signed)
 _X__ Health Department Form received and placed in yellow pod RN basket ____ Form collected by RN and nurse portion complete ____ Form placed in PCP basket in pod ____ Form completed by PCP and collected by front office leadership ____ Form faxed or Parent notified form is ready for pick up at front desk

## 2024-03-22 NOTE — Telephone Encounter (Signed)
 Form not found

## 2024-05-04 ENCOUNTER — Ambulatory Visit (INDEPENDENT_AMBULATORY_CARE_PROVIDER_SITE_OTHER): Payer: Self-pay | Admitting: Mental Health

## 2024-05-04 DIAGNOSIS — F322 Major depressive disorder, single episode, severe without psychotic features: Secondary | ICD-10-CM

## 2024-05-04 DIAGNOSIS — F411 Generalized anxiety disorder: Secondary | ICD-10-CM | POA: Diagnosis not present

## 2024-05-05 DIAGNOSIS — F411 Generalized anxiety disorder: Secondary | ICD-10-CM | POA: Insufficient documentation

## 2024-05-05 DIAGNOSIS — F322 Major depressive disorder, single episode, severe without psychotic features: Secondary | ICD-10-CM | POA: Insufficient documentation
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
## 2024-05-19 ENCOUNTER — Encounter (HOSPITAL_COMMUNITY): Payer: Self-pay

## 2024-05-19 ENCOUNTER — Ambulatory Visit (INDEPENDENT_AMBULATORY_CARE_PROVIDER_SITE_OTHER): Payer: Self-pay | Admitting: Mental Health

## 2024-05-19 DIAGNOSIS — F322 Major depressive disorder, single episode, severe without psychotic features: Secondary | ICD-10-CM

## 2024-05-19 DIAGNOSIS — F411 Generalized anxiety disorder: Secondary | ICD-10-CM
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
## 2024-05-26 ENCOUNTER — Ambulatory Visit (HOSPITAL_COMMUNITY): Payer: Self-pay | Admitting: Psychiatry

## 2024-06-23 ENCOUNTER — Ambulatory Visit (HOSPITAL_COMMUNITY): Payer: Self-pay | Admitting: Mental Health

## 2024-07-12 ENCOUNTER — Other Ambulatory Visit: Payer: Self-pay

## 2024-07-12 ENCOUNTER — Emergency Department (HOSPITAL_BASED_OUTPATIENT_CLINIC_OR_DEPARTMENT_OTHER): Payer: Self-pay

## 2024-07-12 ENCOUNTER — Encounter (HOSPITAL_BASED_OUTPATIENT_CLINIC_OR_DEPARTMENT_OTHER): Payer: Self-pay

## 2024-07-12 ENCOUNTER — Emergency Department (HOSPITAL_BASED_OUTPATIENT_CLINIC_OR_DEPARTMENT_OTHER)
Admission: EM | Admit: 2024-07-12 | Discharge: 2024-07-12 | Disposition: A | Discharge status: Home / Self Care | Payer: Self-pay | Attending: Emergency Medicine | Admitting: Emergency Medicine

## 2024-07-12 DIAGNOSIS — J101 Influenza due to other identified influenza virus with other respiratory manifestations: Secondary | ICD-10-CM | POA: Insufficient documentation

## 2024-07-12 LAB — RESP PANEL BY RT-PCR (RSV, FLU A&B, COVID)  RVPGX2
Influenza A by PCR: POSITIVE — AB
Influenza B by PCR: NEGATIVE
Resp Syncytial Virus by PCR: NEGATIVE
SARS Coronavirus 2 by RT PCR: NEGATIVE

## 2024-07-12 LAB — PREGNANCY, URINE: Preg Test, Ur: NEGATIVE

## 2024-07-12 LAB — GROUP A STREP BY PCR: Group A Strep by PCR: NOT DETECTED

## 2024-07-12 MED ORDER — ONDANSETRON 4 MG PO TBDP
4.0000 mg | ORAL_TABLET | Freq: Three times a day (TID) | ORAL | 0 refills | Status: AC | PRN
Start: 2024-07-12 — End: ?

## 2024-07-12 MED ORDER — BENZONATATE 100 MG PO CAPS
200.0000 mg | ORAL_CAPSULE | Freq: Once | ORAL | Status: AC
  Administered 2024-07-12: 200 mg via ORAL
  Filled 2024-07-12: qty 2

## 2024-07-12 MED ORDER — BENZONATATE 100 MG PO CAPS: 100.0000 mg | ORAL_CAPSULE | Freq: Three times a day (TID) | ORAL | 0 refills | Status: AC | PRN

## 2024-07-12 MED ORDER — OSELTAMIVIR PHOSPHATE 75 MG PO CAPS
75.0000 mg | ORAL_CAPSULE | Freq: Two times a day (BID) | ORAL | 0 refills | Status: AC
Start: 2024-07-12 — End: ?

## 2024-07-12 NOTE — ED Triage Notes (Signed)
 Pt. Reports sore throat, congestion, coughing, fever, runny nose and headache. Temp. 101.1 PTA. Took tylenol  at 1am, dayquil & ibuprofen  at 11 pm. Pt. Reports a little shortness of breath. Denies n/v at this time

## 2024-07-12 NOTE — ED Provider Notes (Signed)
  EMERGENCY DEPARTMENT AT [facility] Provider Note   CSN: [id number] Arrival date & time: 07/12/24  0151     Patient presents with: Cough   Sue Scott is a 23 y.o. female.   The history is provided by the patient.  Sue Scott is a 23 y.o. female who presents to the Emergency Department complaining of fever.  She presents to the emergency department accompanied by her mother for evaluation of fever that started on Sunday night.  She reports that she was sick about a week ago but recovered throughout the week only to get sick again on Sunday night with fever, body aches, sore throat, runny nose, sneezing and ear fullness.  No vomiting or diarrhea.  She did take DayQuil prior to ED arrival.  She has a history of PCOS, no additional medical problems.  Denies any chance of pregnancy.     Prior to Admission medications   Medication Sig Start Date End Date Taking? Authorizing Provider  benzonatate  (TESSALON ) 100 MG capsule Take 1 capsule (100 mg total) by mouth 3 (three) times daily as needed. 07/12/24  Yes Griselda Norris, MD  ondansetron  (ZOFRAN -ODT) 4 MG disintegrating tablet Take 1 tablet (4 mg total) by mouth every 8 (eight) hours as needed. 07/12/24  Yes Griselda Norris, MD  oseltamivir (TAMIFLU) 75 MG capsule Take 1 capsule (75 mg total) by mouth every 12 (twelve) hours. 07/12/24  Yes Griselda Norris, MD  albuterol  (VENTOLIN  HFA) 108 [phone] Base) MCG/ACT inhaler Inhale 1-2 puffs into the lungs every 4 (four) hours as needed for wheezing or shortness of breath. 01/27/22   Banister, Pamela K, MD  Albuterol  Sulfate 2.5 MG/0.5ML NEBU Inhale 2.5 mg into the lungs every 4 (four) hours as needed (Wheezing, Coughing, SOB). 11/27/11   [provider]  amoxicillin -clavulanate (AUGMENTIN ) 875-125 MG tablet Take 1 tablet by mouth every 12 (twelve) hours. 09/19/23   Billy Asberry FALCON, PA-C  Ascorbic Acid (VITAMIN C) 100 MG tablet Take 100 mg by mouth daily. Patient not  taking: Reported on 06/04/2023    [provider]  Beclomethasone Dipropionate (QNASL) 80 MCG/ACT AERS Place into the nose as directed. 11/27/11   [provider]  Boric Acid Vaginal 600 MG SUPP Place vaginally.    [provider]  cetirizine  (ZYRTEC ) 10 MG tablet Take 10 mg by mouth daily. 11/27/11   [provider]  Cyanocobalamin (B-12 PO) Take by mouth. Patient not taking: Reported on 06/04/2023    [provider]  FENUGREEK PO Take by mouth. Patient not taking: Reported on 06/04/2023    [provider]  Ferrous Gluconate (IRON 27 PO) Take by mouth. Patient not taking: Reported on 06/04/2023    [provider]  Misc Natural Products (CRANBERRY/PROBIOTIC PO) Take by mouth. Patient not taking: Reported on 06/04/2023    [provider]  montelukast (SINGULAIR) 5 MG chewable tablet Chew 5 mg by mouth at bedtime. 11/27/11   [provider]  multivitamin-iron-minerals-folic acid (CENTRUM) chewable tablet Chew 1 tablet by mouth daily. Patient not taking: Reported on 06/04/2023 08/21/21   Joshua Bari HERO, NP  naproxen  (NAPROSYN ) 500 MG tablet Take 1 tablet (500 mg total) by mouth 2 (two) times daily with a meal. Patient not taking: Reported on 06/04/2023 01/22/23   Joshua Bari HERO, NP  norgestimate -ethinyl estradiol  (SPRINTEC 28) 0.25-35 MG-MCG tablet Take 1 tablet daily. Discard placebos and take active pills for continuous cycling Patient not taking: Reported on 06/04/2023 02/19/23   Joshua Bari HERO,  NP  Omega-3 Fatty Acids (OMEGA-3 EPA FISH OIL PO) Take by mouth. Patient not taking: Reported on 06/04/2023    [provider]  Omeprazole Magnesium (PRILOSEC PO) Take 20 mg by mouth daily. 11/27/11   [provider]  Turmeric (QC TUMERIC COMPLEX PO) Take by mouth. Patient not taking: Reported on 06/04/2023    [provider]    Allergies: Justicia adhatoda (malabar nut tree) [justicia adhatoda] and Other     Review of Systems  All other systems reviewed and are negative.   Updated Vital Signs BP (!) 109/93   Pulse 99   Temp 98.3 F (36.8 C) (Oral)   Resp 20   LMP 10/07/2023 Comment: pt. reports PCOS no periods  SpO2 97%   Physical Exam Vitals and nursing note reviewed.  Constitutional:      Appearance: She is well-developed.  HENT:     Head: Normocephalic and atraumatic.     Comments: No erythema or edema in the posterior oropharynx    Right Ear: Tympanic membrane normal.     Left Ear: Tympanic membrane normal.  Cardiovascular:     Rate and Rhythm: Regular rhythm. Tachycardia present.     Heart sounds: No murmur heard. Pulmonary:     Effort: Pulmonary effort is normal. No respiratory distress.     Breath sounds: Normal breath sounds.  Abdominal:     Palpations: Abdomen is soft.     Tenderness: There is no abdominal tenderness. There is no guarding or rebound.  Musculoskeletal:        General: No tenderness.  Skin:    General: Skin is warm and dry.  Neurological:     Mental Status: She is alert and oriented to person, place, and time.  Psychiatric:        Behavior: Behavior normal.     (all labs ordered are listed, but only abnormal results are displayed) Labs Reviewed  RESP PANEL BY RT-PCR (RSV, FLU A&B, COVID)  [id number] - Abnormal; Notable for the following components:      Result Value   Influenza A by PCR POSITIVE (*)    All other components within normal limits  GROUP A STREP BY PCR  PREGNANCY, URINE    EKG: None  Radiology: DG Chest 2 View Result Date: 07/12/2024 EXAM: 2 VIEW(S) XRAY OF THE CHEST 07/12/2024 02:47:00 AM COMPARISON: None available. CLINICAL HISTORY: fever, cough. Reports sore throat, congestion, coughing, fever, runny nose and headache. Temp. 101.1 Pt states some shortness of breath. Pt shielded. Pt states she has PCOS and that's why its been since January when she had her menstrual cycle. FINDINGS: LUNGS AND PLEURA: No focal pulmonary  opacity. No pulmonary edema. No pleural effusion. No pneumothorax. HEART AND MEDIASTINUM: No acute abnormality of the cardiac and mediastinal silhouettes. BONES AND SOFT TISSUES: No acute osseous abnormality. IMPRESSION: 1. No acute process. Electronically signed by: Dorethia Molt MD 07/12/2024 02:50 AM EDT RP Workstation: [id number]     Procedures   Medications Ordered in the ED  benzonatate  (TESSALON ) capsule 200 mg (200 mg Oral Given 07/12/24 0230)                                    Medical Decision Making Amount and/or Complexity of Data Reviewed Labs: ordered. Radiology: ordered.  Risk Prescription drug management.   Patient with history of PCOS here for evaluation of fever, cough and runny nose.  She is nontoxic-appearing  on evaluation with good air movement bilaterally.  Chest x-ray is negative for acute abnormality-images personally reviewed and interpreted, agree with radiologist interpretation.  Her influenza test is positive.  She is within 48 hours of onset of symptoms.  Discussed risk and benefit of Tamiflu and she would like to trial this medication.  Discussed with patient home care for influenza with outpatient follow-up as well as return precautions.     Final diagnoses:  Influenza A    ED Discharge Orders          Ordered    oseltamivir (TAMIFLU) 75 MG capsule  Every 12 hours        07/12/24 0344    benzonatate  (TESSALON ) 100 MG capsule  3 times daily PRN        07/12/24 0344    ondansetron  (ZOFRAN -ODT) 4 MG disintegrating tablet  Every 8 hours PRN        07/12/24 0344               Griselda Norris, MD 07/12/24 [phone]

## 2024-07-15 ENCOUNTER — Encounter (HOSPITAL_BASED_OUTPATIENT_CLINIC_OR_DEPARTMENT_OTHER): Payer: Self-pay

## 2024-07-15 ENCOUNTER — Emergency Department (HOSPITAL_BASED_OUTPATIENT_CLINIC_OR_DEPARTMENT_OTHER)
Admission: EM | Admit: 2024-07-15 | Discharge: 2024-07-15 | Disposition: A | Discharge status: Home / Self Care | Payer: Self-pay | Attending: Emergency Medicine | Admitting: Emergency Medicine

## 2024-07-15 ENCOUNTER — Other Ambulatory Visit: Payer: Self-pay

## 2024-07-15 DIAGNOSIS — J45909 Unspecified asthma, uncomplicated: Secondary | ICD-10-CM | POA: Insufficient documentation

## 2024-07-15 DIAGNOSIS — J111 Influenza due to unidentified influenza virus with other respiratory manifestations: Secondary | ICD-10-CM | POA: Insufficient documentation

## 2024-07-15 DIAGNOSIS — Z8709 Personal history of other diseases of the respiratory system: Secondary | ICD-10-CM

## 2024-07-15 MED ORDER — PREDNISONE 20 MG PO TABS: 40.0000 mg | ORAL_TABLET | Freq: Every day | ORAL | 0 refills | Status: AC

## 2024-07-15 MED ORDER — GUAIFENESIN-CODEINE 100-10 MG/5ML PO SOLN: 5.0000 mL | Freq: Three times a day (TID) | ORAL | 0 refills | Status: AC | PRN

## 2024-07-15 MED ORDER — PREDNISONE 20 MG PO TABS
40.0000 mg | ORAL_TABLET | Freq: Once | ORAL | Status: AC
Start: 2024-07-15 — End: 2024-07-15
  Administered 2024-07-15: 40 mg via ORAL
  Filled 2024-07-15: qty 2

## 2024-07-15 MED ORDER — ALBUTEROL SULFATE HFA 108 (90 BASE) MCG/ACT IN AERS
2.0000 | INHALATION_SPRAY | RESPIRATORY_TRACT | Status: AC
  Administered 2024-07-15: 2 via RESPIRATORY_TRACT
  Filled 2024-07-15: qty 6.7

## 2024-07-15 MED ORDER — GUAIFENESIN-CODEINE 100-10 MG/5ML PO SOLN
5.0000 mL | Freq: Once | ORAL | Status: AC
  Administered 2024-07-15: 5 mL via ORAL
  Filled 2024-07-15: qty 5

## 2024-07-15 NOTE — ED Provider Notes (Signed)
 [facility] Provider Note   CSN: [id number] Arrival date & time: 07/15/24  2126     Patient presents with: Influenza   Sue Scott is a 23 y.o. female.   23 year old female with history of asthma who presents to the emergency department with cough.  Patient reports that she was diagnosed with the flu on 10/28.  Has been having symptoms for over a week.  Has been having fevers and chills as well as congestion and cough.  When she was in the emergency department 3 days ago did have a chest x-ray that did not show pneumonia and was found to have influenza.  Has been taking Tamiflu for this as well.  Says that the cough has been pretty bothersome so decided to come back to the emergency       Prior to Admission medications   Medication Sig Start Date End Date Taking? Authorizing Provider  guaiFENesin-codeine 100-10 MG/5ML syrup Take 5 mLs by mouth 3 (three) times daily as needed for cough. 07/15/24  Yes Yolande Lamar BROCKS, MD  predniSONE  (DELTASONE ) 20 MG tablet Take 2 tablets (40 mg total) by mouth daily for 4 days. 07/15/24 07/19/24 Yes Yolande Lamar BROCKS, MD  albuterol  (VENTOLIN  HFA) 108 [phone] Base) MCG/ACT inhaler Inhale 1-2 puffs into the lungs every 4 (four) hours as needed for wheezing or shortness of breath. 01/27/22   Banister, Pamela K, MD  Albuterol  Sulfate 2.5 MG/0.5ML NEBU Inhale 2.5 mg into the lungs every 4 (four) hours as needed (Wheezing, Coughing, SOB). 11/27/11   [provider]  amoxicillin -clavulanate (AUGMENTIN ) 875-125 MG tablet Take 1 tablet by mouth every 12 (twelve) hours. 09/19/23   Billy Asberry FALCON, PA-C  Ascorbic Acid (VITAMIN C) 100 MG tablet Take 100 mg by mouth daily. Patient not taking: Reported on 06/04/2023    [provider]  Beclomethasone Dipropionate (QNASL) 80 MCG/ACT AERS Place into the nose as directed. 11/27/11   [provider]  benzonatate  (TESSALON ) 100 MG capsule Take 1 capsule  (100 mg total) by mouth 3 (three) times daily as needed. 07/12/24   Griselda Norris, MD  Boric Acid Vaginal 600 MG SUPP Place vaginally.    [provider]  cetirizine  (ZYRTEC ) 10 MG tablet Take 10 mg by mouth daily. 11/27/11   [provider]  Cyanocobalamin (B-12 PO) Take by mouth. Patient not taking: Reported on 06/04/2023    [provider]  FENUGREEK PO Take by mouth. Patient not taking: Reported on 06/04/2023    [provider]  Ferrous Gluconate (IRON 27 PO) Take by mouth. Patient not taking: Reported on 06/04/2023    [provider]  Misc Natural Products (CRANBERRY/PROBIOTIC PO) Take by mouth. Patient not taking: Reported on 06/04/2023    [provider]  montelukast (SINGULAIR) 5 MG chewable tablet Chew 5 mg by mouth at bedtime. 11/27/11   [provider]  multivitamin-iron-minerals-folic acid (CENTRUM) chewable tablet Chew 1 tablet by mouth daily. Patient not taking: Reported on 06/04/2023 08/21/21   Joshua Bari HERO, NP  naproxen  (NAPROSYN ) 500 MG tablet Take 1 tablet (500 mg total) by mouth 2 (two) times daily with a meal. Patient not taking: Reported on 06/04/2023 01/22/23   Joshua Bari HERO, NP  norgestimate -ethinyl estradiol  (SPRINTEC 28) 0.25-35 MG-MCG tablet Take 1 tablet daily. Discard placebos and take active pills for continuous cycling Patient not taking: Reported on 06/04/2023 02/19/23   Joshua Bari HERO, NP  Omega-3 Fatty Acids (OMEGA-3 EPA FISH OIL  PO) Take by mouth. Patient not taking: Reported on 06/04/2023    [provider]  Omeprazole Magnesium (PRILOSEC PO) Take 20 mg by mouth daily. 11/27/11   [provider]  ondansetron  (ZOFRAN -ODT) 4 MG disintegrating tablet Take 1 tablet (4 mg total) by mouth every 8 (eight) hours as needed. 07/12/24   Griselda Norris, MD  oseltamivir (TAMIFLU) 75 MG capsule Take 1 capsule (75 mg total) by mouth every 12 (twelve) hours. 07/12/24   Griselda Norris, MD  Turmeric  (QC TUMERIC COMPLEX PO) Take by mouth. Patient not taking: Reported on 06/04/2023    [provider]    Allergies: Justicia adhatoda (malabar nut tree) [justicia adhatoda] and Other    Review of Systems  Updated Vital Signs BP 125/74 (BP Location: Right Arm)   Pulse 82   Temp 97.8 F (36.6 C) (Oral)   Resp 16   LMP 10/07/2023 Comment: pt. reports PCOS no periods  SpO2 98%   Physical Exam Constitutional:      Appearance: Normal appearance.  HENT:     Head: Normocephalic and atraumatic.     Right Ear: External ear normal.     Left Ear: External ear normal.     Nose: Congestion present.  Cardiovascular:     Rate and Rhythm: Normal rate and regular rhythm.     Pulses: Normal pulses.     Heart sounds: Normal heart sounds.  Pulmonary:     Effort: Pulmonary effort is normal.     Breath sounds: Wheezing (Faint expiratory) present.  Musculoskeletal:     Right lower leg: No edema.     Left lower leg: No edema.  Neurological:     Mental Status: She is alert.     (all labs ordered are listed, but only abnormal results are displayed) Labs Reviewed - No data to display  EKG: None  Radiology: No results found.   Procedures   Medications Ordered in the ED  predniSONE  (DELTASONE ) tablet 40 mg (40 mg Oral Given 07/15/24 2236)  guaiFENesin-codeine 100-10 MG/5ML solution 5 mL (5 mLs Oral Given 07/15/24 2236)  albuterol  (VENTOLIN  HFA) 108 (90 Base) MCG/ACT inhaler 2 puff (2 puffs Inhalation Given 07/15/24 2233)                                    Medical Decision Making Risk OTC drugs. Prescription drug management.   24 year old female who presents to the emergency department with cough after being diagnosed with influenza  Initial Ddx:  Flu, pneumonia, asthma, bronchitis  MDM/Course:  Patient presents emergency department with persistent cough after being diagnosed with the flu recently.  Had a chest x-ray then that did not show pneumonia.  On arrival her  vital signs are all within normal limits.  Does have some pain expiratory wheezing and congestion on exam but is not in acute distress.  Suspect that she is getting over the flu.  Will give her albuterol  and short course of prednisone  in case her asthma is flaring up causing her cough to worsen.  Also given codeine to take at night for cough.  Will have her follow-up with her primary doctor in several days  This patient presents to the ED for concern of complaints listed in HPI, this involves an extensive number of treatment options, and is a complaint that carries with it a high risk of complications and morbidity. Disposition including potential need for admission considered.  Dispo: DC Home. Return precautions discussed including, but not limited to, those listed in the AVS. Allowed pt time to ask questions which were answered fully prior to dc.  Additional history obtained from mother Records reviewed Outpatient Clinic Notes I independently reviewed the following imaging with scope of interpretation limited to determining acute life threatening conditions related to emergency care: Chest x-ray and agree with the radiologist interpretation with the following exceptions: none I have reviewed the patients home medications and made adjustments as needed  Portions of this note were generated with Dragon dictation software. Dictation errors may occur despite best attempts at proofreading.     Final diagnoses:  Influenza  History of asthma    ED Discharge Orders          Ordered    predniSONE  (DELTASONE ) 20 MG tablet  Daily        07/15/24 2213    guaiFENesin-codeine 100-10 MG/5ML syrup  3 times daily PRN        07/15/24 2213               Yolande Lamar BROCKS, MD 07/15/24 2356

## 2024-07-15 NOTE — Discharge Instructions (Signed)
 You were seen for your upper respiratory tract infection in the emergency department.   At home, please use Tylenol  and ibuprofen  for your muscle aches and fevers.  Please use over-the-counter cough medication or tea with honey for your cough. Use the codeine for your cough but please do not take with alcohol or before driving because it can make you drowsy  Use the albuterol  and prednisone  in case your asthma is worsening your cough.   Follow-up with your primary doctor in 2-3 days regarding your visit.  This may be over the phone.  Return immediately to the emergency department if you experience any of the following: Difficulty breathing, or any other concerning symptoms.    Thank you for visiting our Emergency Department. It was a pleasure taking care of you today.

## 2024-07-15 NOTE — ED Triage Notes (Signed)
 Pt to ED from home with known flu diagnosis on 10/28. Pt reports no relief from prescribed meds. Arrives A+O, VSS, NADN.

## 2024-07-19 ENCOUNTER — Ambulatory Visit (HOSPITAL_COMMUNITY): Payer: Self-pay

## 2024-09-20 ENCOUNTER — Telehealth: Payer: Self-pay | Admitting: General Practice

## 2024-09-20 NOTE — Telephone Encounter (Signed)
 Good afternoon,  Patient called and requested recommendations for primary care services to treat her PCOS. Please give her a call at your earliest convenience.  Thanks,

## 2024-11-07 ENCOUNTER — Ambulatory Visit (HOSPITAL_COMMUNITY): Admitting: Mental Health
